# Patient Record
Sex: Female | Born: 1965
Health system: Southern US, Community
[De-identification: ages and names within clinical notes are randomized; demographics above are authoritative.]

## PROBLEM LIST (undated history)

## (undated) DIAGNOSIS — K635 Polyp of colon: Secondary | ICD-10-CM

## (undated) DIAGNOSIS — F419 Anxiety disorder, unspecified: Secondary | ICD-10-CM

## (undated) DIAGNOSIS — E119 Type 2 diabetes mellitus without complications: Secondary | ICD-10-CM

## (undated) DIAGNOSIS — Z720 Tobacco use: Secondary | ICD-10-CM

## (undated) DIAGNOSIS — E669 Obesity, unspecified: Secondary | ICD-10-CM

## (undated) DIAGNOSIS — F329 Major depressive disorder, single episode, unspecified: Secondary | ICD-10-CM

## (undated) DIAGNOSIS — K8020 Calculus of gallbladder without cholecystitis without obstruction: Secondary | ICD-10-CM

## (undated) DIAGNOSIS — I10 Essential (primary) hypertension: Secondary | ICD-10-CM

## (undated) DIAGNOSIS — F418 Other specified anxiety disorders: Secondary | ICD-10-CM

## (undated) DIAGNOSIS — F32A Depression, unspecified: Secondary | ICD-10-CM

## (undated) DIAGNOSIS — J189 Pneumonia, unspecified organism: Secondary | ICD-10-CM

## (undated) DIAGNOSIS — R011 Cardiac murmur, unspecified: Secondary | ICD-10-CM

## (undated) DIAGNOSIS — R9439 Abnormal result of other cardiovascular function study: Secondary | ICD-10-CM

## (undated) HISTORY — PX: LAPAROSCOPIC CHOLECYSTECTOMY: SUR755

## (undated) HISTORY — DX: Calculus of gallbladder without cholecystitis without obstruction: K80.20

## (undated) HISTORY — DX: Depression, unspecified: F32.A

## (undated) HISTORY — DX: Major depressive disorder, single episode, unspecified: F32.9

## (undated) HISTORY — DX: Polyp of colon: K63.5

## (undated) HISTORY — DX: Anxiety disorder, unspecified: F41.9

---

## 1998-11-28 ENCOUNTER — Emergency Department (HOSPITAL_COMMUNITY): Admission: EM | Admit: 1998-11-28 | Discharge: 1998-11-28 | Payer: Self-pay | Admitting: Emergency Medicine

## 1998-11-28 ENCOUNTER — Encounter: Payer: Self-pay | Admitting: Emergency Medicine

## 1999-05-26 ENCOUNTER — Other Ambulatory Visit: Admission: RE | Admit: 1999-05-26 | Discharge: 1999-05-26 | Payer: Self-pay | Admitting: Obstetrics and Gynecology

## 1999-11-12 ENCOUNTER — Inpatient Hospital Stay (HOSPITAL_COMMUNITY): Admission: AD | Admit: 1999-11-12 | Discharge: 1999-11-12 | Payer: Self-pay | Admitting: Obstetrics and Gynecology

## 1999-11-13 ENCOUNTER — Inpatient Hospital Stay (HOSPITAL_COMMUNITY): Admission: AD | Admit: 1999-11-13 | Discharge: 1999-11-13 | Payer: Self-pay | Admitting: *Deleted

## 1999-11-20 ENCOUNTER — Inpatient Hospital Stay (HOSPITAL_COMMUNITY): Admission: AD | Admit: 1999-11-20 | Discharge: 1999-11-20 | Payer: Self-pay | Admitting: Obstetrics and Gynecology

## 1999-11-22 ENCOUNTER — Inpatient Hospital Stay (HOSPITAL_COMMUNITY): Admission: AD | Admit: 1999-11-22 | Discharge: 1999-11-22 | Payer: Self-pay | Admitting: Obstetrics and Gynecology

## 1999-11-26 ENCOUNTER — Inpatient Hospital Stay (HOSPITAL_COMMUNITY): Admission: AD | Admit: 1999-11-26 | Discharge: 1999-11-26 | Payer: Self-pay | Admitting: Obstetrics and Gynecology

## 1999-12-07 ENCOUNTER — Inpatient Hospital Stay (HOSPITAL_COMMUNITY): Admission: AD | Admit: 1999-12-07 | Discharge: 1999-12-11 | Payer: Self-pay | Admitting: Obstetrics and Gynecology

## 2000-01-09 ENCOUNTER — Other Ambulatory Visit: Admission: RE | Admit: 2000-01-09 | Discharge: 2000-01-09 | Payer: Self-pay | Admitting: Obstetrics and Gynecology

## 2000-03-26 ENCOUNTER — Other Ambulatory Visit: Admission: RE | Admit: 2000-03-26 | Discharge: 2000-03-26 | Payer: Self-pay | Admitting: Obstetrics and Gynecology

## 2000-03-26 ENCOUNTER — Encounter (INDEPENDENT_AMBULATORY_CARE_PROVIDER_SITE_OTHER): Payer: Self-pay | Admitting: Specialist

## 2005-06-10 HISTORY — PX: CARDIAC CATHETERIZATION: SHX172

## 2005-06-12 ENCOUNTER — Ambulatory Visit: Payer: Self-pay | Admitting: Cardiology

## 2005-06-21 ENCOUNTER — Ambulatory Visit: Payer: Self-pay | Admitting: Cardiology

## 2005-06-27 ENCOUNTER — Ambulatory Visit: Payer: Self-pay | Admitting: Internal Medicine

## 2005-06-27 ENCOUNTER — Inpatient Hospital Stay (HOSPITAL_BASED_OUTPATIENT_CLINIC_OR_DEPARTMENT_OTHER): Admission: RE | Admit: 2005-06-27 | Discharge: 2005-06-27 | Payer: Self-pay | Admitting: Internal Medicine

## 2005-07-03 ENCOUNTER — Ambulatory Visit: Payer: Self-pay | Admitting: Cardiology

## 2006-08-13 ENCOUNTER — Emergency Department (HOSPITAL_COMMUNITY): Admission: EM | Admit: 2006-08-13 | Discharge: 2006-08-13 | Payer: Self-pay | Admitting: Emergency Medicine

## 2009-03-12 HISTORY — PX: SHOULDER OPEN ROTATOR CUFF REPAIR: SHX2407

## 2009-05-05 ENCOUNTER — Emergency Department (HOSPITAL_COMMUNITY): Admission: EM | Admit: 2009-05-05 | Discharge: 2009-05-05 | Payer: Self-pay | Admitting: Emergency Medicine

## 2010-07-28 NOTE — Discharge Summary (Signed)
[facility] [place]  Patient:    Victoria Lucas, Victoria Lucas                       MRN: [id number] Adm. Date:  [id number] Disc. Date: [id number] Attending:  Trevor Iha Dictator:   Danie Chandler, R.N.                           Discharge Summary  ADMITTING DIAGNOSES:          1. Intrauterine pregnancy at term.                               2. Spontaneous rupture of membranes.                               3. Onset of labor.  DISCHARGE DIAGNOSES:          1. Intrauterine pregnancy at term.                               2. Spontaneous rupture of membranes.                               3. Onset of labor.                               4. Failure to progress.  PROCEDURE:                    On December 08, 1999, primary low transverse cesarean section.  REASON FOR ADMISSION:         The patient is a 45 year old primigravida married white female who presented at term with premature rupture of membranes at 4:30 p.m. on the day of December 07, 1999.  The patient was begun on Pitocin augmentation and progressed to 4 cm dilatation.  After three to four hours she had complete lack of progression beyond this.  Adequate uterine contractions were documented by the IUPC.  She was placed on IV Cleocin for prolonged rupture of membranes.  Due to the lack of progression the patient was made ready for cesarean section.  HOSPITAL COURSE:              The patient was taken to the operating room and underwent the above-named procedure without complication.  This was productive of a viable female infant with Apgars of 8 at one minute and 9 at five minutes. Postoperatively, the patient did well.  On postoperative day #1, the patients hemoglobin was 10.3, her vital signs were stable, her white blood cell count was 18.9, and hematocrit 30.5.  On postoperative day #2, she had a good return of bowel function and was tolerating a regular diet.  She was also ambulating well without  difficulty and had good pain control.  She was discharged home on postoperative day #3.  CONDITION ON DISCHARGE:       Good.  DIET:                          Regular as tolerated.  ACTIVITY:  No heavy lifting.  No driving.  No vaginal entry.  FOLLOW-UP:                    She is to follow up in the office in three days for stable removal.  DISCHARGE INSTRUCTIONS:       She is to call for temperature greater than 100 degrees, persistent nausea, vomiting, heavy vaginal bleeding, and/or redness or drainage from the incision site.  DISCHARGE MEDICATIONS:        1. Prenatal vitamin one p.o. q.d.                               2. Tylox #30 one to two p.o. q.3-4h. p.r.n.                                  pain. DD:  12/29/99 TD:  12/30/99 Job: [id number]

## 2010-07-28 NOTE — Cardiovascular Report (Signed)
[name], [name] NO.:  [id number]   MEDICAL RECORD NO.:  [id number]          PATIENT TYPE:  OIB   LOCATION:  1962                         FACILITY:  [facility]   PHYSICIAN:  Arvilla Meres, M.D. LHCDATE OF BIRTH:  [birth year]   DATE OF PROCEDURE:  06/27/2005  DATE OF DISCHARGE:                              CARDIAC CATHETERIZATION   PRIMARY CARE PHYSICIAN:  Dr. Fara Chute in [place].   CARDIOLOGIST:  Learta Codding, M.D. [facility]   PATIENT IDENTIFICATION:  Victoria Lucas is a 45 year old woman with multiple  cardiac risk factors including obesity, hyperlipidemia, and ongoing tobacco  use who was rescinded to [facility] with chest pain.  This had both  typical and atypical features.  She ruled out for myocardial infarction with  serial cardiac enzymes.  She underwent stress echocardiogram which was  limited due to inability to reach target heart rate, however, there is no  evidence of wall motion abnormalities at submaximal levels of exercise.  Unfortunately, she has continued to have chest pain and is now referred for  a diagnostic cardiac catheterization in our outpatient laboratory.   PROCEDURES PERFORMED:  1.  Selective coronary angiography.  2.  Left heart catheterization.  3.  Left ventriculogram.   DESCRIPTION OF PROCEDURE:  The risks and benefits of the catheterization  were explained.  Consent was signed and placed on the chart.  A 4-French  arterial sheath was placed in the right femoral artery using a modified  Seldinger technique.  Standard catheters including a JL-4, 3-DRC, and angled  pigtail were used for procedure.  All catheter exchanges made over a wire.  There were no apparent complications.   1.  Central aortic pressure is 109/62 with a mean of 82.  2.  LV pressure was 105/0 with an EDP of 6.  3.  There is no aortic stenosis.   1.  Left main was short and normal.  2.  LAD was a very long vessel wrapping the apex.  There was a small first     diagonal and moderate-to-large second diagonal.  There was no      angiographic CAD.  3.  Left circumflex was a moderate-sized system.  It gave off a moderate-      sized OM-1 and a branching OM-2.  No angiographic CAD.  4.  Right coronary artery was a moderate-sized dominant vessel.  It gave off      an RV branch, a large acute marginal, a small-to-moderate PDA, and two      small PLs.  There is no angiographic CAD.   LEFT VENTRICULOGRAM:  Done the RAO position showed an EF of 55% with a  question of minimal anterior hypokinesis.  There was mild mitral valve  prolapse with no mitral regurgitation.   ASSESSMENT:  1.  Normal coronary arteries.  2.  Normal left ventricular function with a question of minimal anterior      hypokinesis of uncertain significance.  3.  Mild mitral valve prolapse without any mitral regurgitation.   PLAN/DISCUSSION:  I suspect her chest pain is noncardiac.  She will  need  aggressive risk factor modification.  I particularly stressed to her the  need for smoking cessation.  As well as control of her hyperlipidemia and  weight.      Arvilla Meres, M.D. [facility]  Electronically Signed     DB/MEDQ  D:  06/27/2005  T:  06/27/2005  Job:  [id number]   cc:   Fara Chute  Fax: [phone]   Learta Codding, M.D. [facility]  [address]  [address]  [place]  [place] [address]

## 2010-07-28 NOTE — Op Note (Signed)
[facility]  Patient:    Victoria Lucas, Victoria Lucas                       MRN: [id number] Proc. Date: 12/08/99 Adm. Date:  [id number] Attending:  Trevor Iha                           Operative Report  PREOPERATIVE DIAGNOSIS:         Intrauterine pregnancy at term with failure to progress.  POSTOPERATIVE DIAGNOSIS:        Intrauterine pregnancy at term with failure to progress.  OPERATION:                      Low transverse cesarean section.  SURGEON:                        Juluis Mire, M.D.  ANESTHESIA:                     Epidural.  ESTIMATED BLOOD LOSS:           800 cc.  PACKS AND DRAINS:               None.  INTRAOPERATIVE BLOOD PLACED:    None.  COMPLICATIONS:                  None.  INDICATIONS:                    [age]-year-old prima gravida, married white female presented at term with premature rupture of membranes at 4:30 yesterday evening.  She was begun on Pitocin augmentation and progressed to 4 cm dilatation.  After three to four hours she had complete lack of progression beyond this.  Adequate uterine contractions were documented by the intrauterine pressure catheter after given Pitocin.  She was on IV Cleocin for prolonged rupture of membranes.  Due to the lack of progression the patient was made ready for cesarean section.  The risks were discussed including the risks of infection, the risks of hemorrhage which could necessitate transfusion with the risk of AIDS or hepatitis, the risk of injury to adjacent organs including bladder, bowel or ureters that could require further exploratory surgery, risk of deep venous thrombosis and pulmonary emboli.  The patient understands indications and risks.  DESCRIPTION OF PROCEDURE:       The patient was taken to the operating room and placed in the supine position with left lateral tilt.  After a satisfactory level of epidural anesthesia was obtained, the abdomen was prepped  out with Betadine and draped in a sterile field.  A low transverse skin incision was made with a knife and carried through the subcutaneous tissue.  The anterior rectus fascia was entered sharply and incision to the fascia was extended laterally.  The fascia was taken off the of muscle superiorly and inferiorly using both blunt and sharp dissection.  The rectus muscles were separated in the midline.  The anterior peritoneum was entered sharply and incision to the peritoneum was extended both superiorly and inferiorly.  A low transverse bladder flap was developed.  A low transverse uterine incision was begun with the knife and extended laterally using manual retraction.  The infant presented in the vertex presentation and was delivered with elevation of the head and fundal pressure.  The infant was a female who  weighed 7 pounds 6 ounces.  Apgars were 8 and 9.  Umbilical cord pH is pending.  The placenta was then delivered manually.  Uterus was exteriorized for closure.  The uterus was wiped free of remaining membranes and placenta.  The uterus was closed with interlocking sutures of 0 chromic using a two layer closure technique.  We had good hemostasis.  The pelvic catheter was thoroughly irrigated.  Hemostasis was excellent.  Tubes and ovaries appeared unremarkable.  Urine output remained clear.  The uterus was then returned to the abdominal cavity.  Muscles were reapproximated with suture of 3-0 Vicryl. The fascia was closed with suture of 0 PDS.  Skin was closed with staples and Steri-Strips.  The sponge, needle and instrument counts were reported as correct by the circulating nurse x 2.  The patient tolerated the procedure well and returned to the recovery room in good condition.  Urine output remained clear at the time of closure. DD:  12/08/99 TD:  12/09/99 Job: [id number]

## 2016-06-15 ENCOUNTER — Encounter (HOSPITAL_COMMUNITY): Payer: Self-pay | Admitting: Cardiology

## 2016-06-15 ENCOUNTER — Inpatient Hospital Stay (HOSPITAL_COMMUNITY)
Admission: AD | Admit: 2016-06-15 | Discharge: 2016-06-18 | DRG: 287 | Disposition: A | Discharge status: Home / Self Care | Payer: 59 | Source: Other Acute Inpatient Hospital | Attending: Internal Medicine | Admitting: Internal Medicine

## 2016-06-15 DIAGNOSIS — E1165 Type 2 diabetes mellitus with hyperglycemia: Secondary | ICD-10-CM | POA: Diagnosis present

## 2016-06-15 DIAGNOSIS — I452 Bifascicular block: Secondary | ICD-10-CM | POA: Diagnosis not present

## 2016-06-15 DIAGNOSIS — I952 Hypotension due to drugs: Secondary | ICD-10-CM | POA: Diagnosis not present

## 2016-06-15 DIAGNOSIS — F17200 Nicotine dependence, unspecified, uncomplicated: Secondary | ICD-10-CM | POA: Diagnosis not present

## 2016-06-15 DIAGNOSIS — Z7984 Long term (current) use of oral hypoglycemic drugs: Secondary | ICD-10-CM | POA: Diagnosis not present

## 2016-06-15 DIAGNOSIS — R9430 Abnormal result of cardiovascular function study, unspecified: Secondary | ICD-10-CM | POA: Diagnosis present

## 2016-06-15 DIAGNOSIS — E785 Hyperlipidemia, unspecified: Secondary | ICD-10-CM | POA: Diagnosis not present

## 2016-06-15 DIAGNOSIS — R079 Chest pain, unspecified: Secondary | ICD-10-CM | POA: Diagnosis present

## 2016-06-15 DIAGNOSIS — I25110 Atherosclerotic heart disease of native coronary artery with unstable angina pectoris: Principal | ICD-10-CM | POA: Diagnosis present

## 2016-06-15 DIAGNOSIS — I10 Essential (primary) hypertension: Secondary | ICD-10-CM | POA: Diagnosis not present

## 2016-06-15 DIAGNOSIS — Z720 Tobacco use: Secondary | ICD-10-CM

## 2016-06-15 DIAGNOSIS — T463X5A Adverse effect of coronary vasodilators, initial encounter: Secondary | ICD-10-CM | POA: Diagnosis not present

## 2016-06-15 DIAGNOSIS — R072 Precordial pain: Secondary | ICD-10-CM | POA: Diagnosis not present

## 2016-06-15 DIAGNOSIS — E669 Obesity, unspecified: Secondary | ICD-10-CM | POA: Diagnosis present

## 2016-06-15 DIAGNOSIS — Z6832 Body mass index (BMI) 32.0-32.9, adult: Secondary | ICD-10-CM

## 2016-06-15 DIAGNOSIS — E119 Type 2 diabetes mellitus without complications: Secondary | ICD-10-CM

## 2016-06-15 DIAGNOSIS — I200 Unstable angina: Secondary | ICD-10-CM | POA: Diagnosis present

## 2016-06-15 HISTORY — DX: Cardiac murmur, unspecified: R01.1

## 2016-06-15 HISTORY — DX: Tobacco use: Z72.0

## 2016-06-15 HISTORY — DX: Essential (primary) hypertension: I10

## 2016-06-15 HISTORY — DX: Abnormal result of other cardiovascular function study: R94.39

## 2016-06-15 HISTORY — DX: Pneumonia, unspecified organism: J18.9

## 2016-06-15 HISTORY — DX: Other specified anxiety disorders: F41.8

## 2016-06-15 HISTORY — DX: Type 2 diabetes mellitus without complications: E11.9

## 2016-06-15 HISTORY — DX: Obesity, unspecified: E66.9

## 2016-06-15 LAB — GLUCOSE, CAPILLARY: GLUCOSE-CAPILLARY: 207 mg/dL — AB (ref 65–99)

## 2016-06-15 MED ORDER — HEPARIN (PORCINE) IN NACL 100-0.45 UNIT/ML-% IJ SOLN
1750.0000 [IU]/h | INTRAMUSCULAR | Status: DC
  Administered 2016-06-16 – 2016-06-17 (×3): 1600 [IU]/h via INTRAVENOUS
  Filled 2016-06-15 (×3): qty 250

## 2016-06-15 MED ORDER — ASPIRIN EC 81 MG PO TBEC
81.0000 mg | DELAYED_RELEASE_TABLET | Freq: Every day | ORAL | Status: DC
  Administered 2016-06-16 – 2016-06-18 (×3): 81 mg via ORAL
  Filled 2016-06-15 (×3): qty 1

## 2016-06-15 MED ORDER — INSULIN ASPART 100 UNIT/ML ~~LOC~~ SOLN
0.0000 [IU] | Freq: Three times a day (TID) | SUBCUTANEOUS | Status: DC
  Administered 2016-06-16: 3 [IU] via SUBCUTANEOUS
  Administered 2016-06-16 (×2): 5 [IU] via SUBCUTANEOUS
  Administered 2016-06-17: 2 [IU] via SUBCUTANEOUS
  Administered 2016-06-17 (×2): 5 [IU] via SUBCUTANEOUS
  Administered 2016-06-18: 3 [IU] via SUBCUTANEOUS

## 2016-06-15 MED ORDER — METOPROLOL TARTRATE 12.5 MG HALF TABLET
12.5000 mg | ORAL_TABLET | Freq: Two times a day (BID) | ORAL | Status: DC
  Administered 2016-06-15 – 2016-06-16 (×2): 12.5 mg via ORAL
  Filled 2016-06-15 (×2): qty 1

## 2016-06-15 MED ORDER — ATORVASTATIN CALCIUM 40 MG PO TABS
40.0000 mg | ORAL_TABLET | Freq: Every day | ORAL | Status: DC
  Administered 2016-06-16 – 2016-06-17 (×2): 40 mg via ORAL
  Filled 2016-06-15 (×2): qty 1

## 2016-06-15 MED ORDER — ACETAMINOPHEN 325 MG PO TABS
325.0000 mg | ORAL_TABLET | Freq: Four times a day (QID) | ORAL | Status: DC | PRN
  Administered 2016-06-16 – 2016-06-17 (×3): 325 mg via ORAL
  Filled 2016-06-15 (×3): qty 1

## 2016-06-15 MED ORDER — NITROGLYCERIN 0.4 MG SL SUBL
0.4000 mg | SUBLINGUAL_TABLET | SUBLINGUAL | Status: DC | PRN
  Administered 2016-06-16 – 2016-06-17 (×4): 0.4 mg via SUBLINGUAL
  Filled 2016-06-15 (×3): qty 1

## 2016-06-15 NOTE — Progress Notes (Signed)
ANTICOAGULATION CONSULT NOTE  Pharmacy Consult for heparin Indication: chest pain/ACS  Heparin Dosing Weight: 76.6 kg   Assessment: 50 yof initially presented to [facility] for CP, UA. She ruled out for MI with negative enzymes. Pt had a positive Myoview stress test and was transferred to [facility] for definitive LHC - planned for Monday. Pharmacy consulted to dose heparin - started at [facility] and verified currently running at 1360 units/h. Not on anticoagulation PTA. CBC pending.  Goal of Therapy:  Heparin level 0.3-0.7 units/ml Monitor platelets by anticoagulation protocol: Yes   Plan:  Heparin at 1360 units/h (from transfer) 6h heparin level Daily heparin level/CBC Monitor for s/sx bleeding Cath planned for Monday   Victoria Lucas, PharmD, BCPS Clinical Pharmacist 06/15/2016 7:39 PM

## 2016-06-15 NOTE — H&P (Signed)
Patient ID: Victoria Lucas MRN: [id number], DOB/AGE: Aug 14, 1965   Admit date: 06/15/2016   Primary Physician:  Dr. Neita Carp  Primary Cardiologist: New   Pt. Profile:  51 y/o female with h/o normal cath in 2007, HTN, T2DM and tobacco use, who initially presented to [facility] for evaluation for chest pain c/w unstable angina. She ruled out for MI with negative enzymes. Pt had a positive Myoview stress test and was transferred to [facility] for definitive LHC.    Problem List  Past Medical History:  Diagnosis Date  . Abnormal nuclear stress test   . DM (diabetes mellitus), type 2 (HCC)   . HTN (hypertension)   . Obesity (BMI 30-39.9)   . Tobacco abuse     Past Surgical History:  Procedure Laterality Date  . CESAREAN SECTION    . CHOLECYSTECTOMY    . ROTATOR CUFF REPAIR      Allergies  Allergies  Allergen Reactions  . Codeine Rash  . Penicillins Rash    Has patient had a PCN reaction causing immediate rash, facial/tongue/throat swelling, SOB or lightheadedness with hypotension: Yes Has patient had a PCN reaction causing severe rash involving mucus membranes or skin necrosis: No Has patient had a PCN reaction that required hospitalization No Has patient had a PCN reaction occurring within the last 10 years: Yes If all of the above answers are "NO", then may proceed with Cephalosporin use.   Marland Kitchen Propoxyphene   . Tamoxifen    HPI  51 y/o female with h/o normal cath in 2007, HTN, T2DM and tobacco use, who initially presented to [facility] for evaluation for chest pain c/w unstable angina. She ruled out for MI with negative enzymes. Pt had a positive Myoview stress test and was transferred to [facility] for definitive LHC.    Stress test demonstrated moderate reversibility involving the anterior apical myocardium with mild left ventricular dilatation with mild anterior anteroseptal segment hypokinesis noted.   Pt states her CP started 4 days ago. Intermittent SSC pressure  radiating to her back with mild dyspnea. The pain typically occurred at rest and would last from 4-5 minutes with occasional radiation to her left arm.  Not associated with sweating but mild shortness of breath with it.  She has arrived at [facility]. She is roomed and stable. She is CP free and on IV heparin. She states she gave up smoking 3 days ago after CP started. The only prescription medication she is on is Metformin. States her blood sugars usually run high.   Home Medications  Metformin   Family History  Family History  Problem Relation Age of Onset  . Valvular heart disease Paternal Grandfather     Social History  Social History   Social History  . Marital status: Married    Spouse name: N/A  . Number of children: N/A  . Years of education: N/A   Occupational History  . Not on file.   Social History Main Topics  . Smoking status: Current Every Day Smoker  . Smokeless tobacco: Current User  . Alcohol use Not on file  . Drug use: Unknown  . Sexual activity: Not on file   Other Topics Concern  . Not on file   Social History Narrative  . No narrative on file     Review of Systems She has lost about 60 pounds in the past year or 2.  She has been able to cut back her diabetic medicines.  She doesn't really have  any significant arthritis. All other systems reviewed and are otherwise negative except as noted above.  Physical Exam  Blood pressure 117/62, pulse 77, temperature 98.1 F (36.7 C), temperature source Oral, resp. rate 18, height  (1.651 m), weight 89.2 kg (196 lb 11.2 oz), SpO2 99 %.  General: Pleasant, NAD, moderately obese  Psych: Normal affect. Neuro: Alert and oriented X 3. Moves all extremities spontaneously. HEENT: Normal  Neck: Supple without bruits or JVD. Lungs:  Resp regular and unlabored, CTA. Heart: RRR no s3, s4, or murmurs. Abdomen: Soft, non-tender, non-distended, BS + x 4.  Extremities: No clubbing, cyanosis or edema. DP/PT/Radials  2+ and equal bilaterally. Pulses: Her radials and femorals are 2+  Labs   ECG  12 lead pending --   ASSESSMENT AND PLAN  Active Problems:   Chest pain   Abnormal result of cardiovascular function study   Diabetes mellitus type 2, noninsulin dependent (HCC)   Obesity (BMI 30-39.9)   Hypertension, essential   Tobacco abuse   Unstable angina (HCC)   1. Unstable Angina w/ Abnormal NST: stress test at outside hospital showed moderate reversibility involving the anterior apical myocardium with mild left ventricular dilatation with mild anterior anteroseptal segment hypokinesis noted.  She is currently CP free. Continue IV heparin. Plan for [facility] on Monday. Start ASA and statin. Monitor on telemetry. Hold Metformin.    Signed, Boyce Medici, PA-C, MHS 06/15/2016,7:25 PM [facility] Pager: [phone]  Patient seen and examined, additional information updated into chart above.  Side records reviewed.  She had a normal catheterization 10 years ago and has risk factors of family history and her grandparents as well as smoking and diabetes.  EKG is pending at the present time  She is on heparin and catheterization is planned for Monday. Cardiac catheterization was discussed with the patient fully including risks of myocardial infarction, death, stroke, bleeding, arrhythmia, dye allergy, renal insufficiency or bleeding.  The patient understands and is willing to proceed.  Possibility of intervention at the same time also discussed with patient and husband and they understand and are agreeable to proceed.  Darden Palmer. MD [facility] 06/15/2016 8:08 PM

## 2016-06-15 NOTE — Progress Notes (Addendum)
Patient arrived the unit from [facility], assessment completed, see flowsheet, placed on tele ccmd notified,  Attending MD notified, bed in lowest position, call bell within reach will continue to monitor.  Heparin infusion continue at 13.6 started at [facility].

## 2016-06-16 LAB — LIPID PANEL
CHOL/HDL RATIO: 3.5 ratio
CHOLESTEROL: 166 mg/dL (ref 0–200)
HDL: 47 mg/dL (ref >40)
LDL Cholesterol: 69 mg/dL (ref 0–99)
Triglycerides: 248 mg/dL — ABNORMAL HIGH (ref <150)
VLDL: 50 mg/dL — ABNORMAL HIGH (ref 0–40)

## 2016-06-16 LAB — BASIC METABOLIC PANEL
Anion gap: 12 (ref 5–15)
BUN: 10 mg/dL (ref 6–20)
CO2: 20 mmol/L — ABNORMAL LOW (ref 22–32)
Calcium: 8.9 mg/dL (ref 8.9–10.3)
Chloride: 103 mmol/L (ref 101–111)
Creatinine, Ser: 0.74 mg/dL (ref 0.44–1.00)
GFR calc Af Amer: >60 mL/min (ref >60)
GLUCOSE: 287 mg/dL — AB (ref 65–99)
Potassium: 3.7 mmol/L (ref 3.5–5.1)
Sodium: 135 mmol/L (ref 135–145)

## 2016-06-16 LAB — GLUCOSE, CAPILLARY
GLUCOSE-CAPILLARY: 277 mg/dL — AB (ref 65–99)
GLUCOSE-CAPILLARY: 278 mg/dL — AB (ref 65–99)
Glucose-Capillary: 234 mg/dL — ABNORMAL HIGH (ref 65–99)
Glucose-Capillary: 219 mg/dL — ABNORMAL HIGH (ref 65–99)

## 2016-06-16 LAB — CBC
HCT: 43.4 % (ref 36.0–46.0)
Hemoglobin: 14.6 g/dL (ref 12.0–15.0)
MCH: 29.7 pg (ref 26.0–34.0)
MCHC: 33.6 g/dL (ref 30.0–36.0)
MCV: 88.4 fL (ref 78.0–100.0)
PLATELETS: 239 10*3/uL (ref 150–400)
RBC: 4.91 MIL/uL (ref 3.87–5.11)
RDW: 14.1 % (ref 11.5–15.5)
WBC: 8.3 10*3/uL (ref 4.0–10.5)

## 2016-06-16 LAB — HEPARIN LEVEL (UNFRACTIONATED)
HEPARIN UNFRACTIONATED: 0.54 [IU]/mL (ref 0.30–0.70)
Heparin Unfractionated: 0.16 IU/mL — ABNORMAL LOW (ref 0.30–0.70)

## 2016-06-16 MED ORDER — METOPROLOL TARTRATE 25 MG PO TABS
25.0000 mg | ORAL_TABLET | Freq: Two times a day (BID) | ORAL | Status: DC
  Administered 2016-06-16 – 2016-06-18 (×4): 25 mg via ORAL
  Filled 2016-06-16 (×4): qty 1

## 2016-06-16 MED ORDER — SENNOSIDES-DOCUSATE SODIUM 8.6-50 MG PO TABS
1.0000 | ORAL_TABLET | Freq: Once | ORAL | Status: AC
  Administered 2016-06-16: 1 via ORAL
  Filled 2016-06-16: qty 1

## 2016-06-16 MED ORDER — HEPARIN BOLUS VIA INFUSION
2000.0000 [IU] | Freq: Once | INTRAVENOUS | Status: AC
  Administered 2016-06-16: 2000 [IU] via INTRAVENOUS
  Filled 2016-06-16: qty 2000

## 2016-06-16 MED ORDER — NITROGLYCERIN 2 % TD OINT
0.5000 [in_us] | TOPICAL_OINTMENT | Freq: Once | TRANSDERMAL | Status: AC
  Administered 2016-06-16: 0.5 [in_us] via TOPICAL
  Filled 2016-06-16: qty 30

## 2016-06-16 NOTE — Progress Notes (Signed)
ANTICOAGULATION CONSULT NOTE - Follow Up Consult  Pharmacy Consult for Heparin Indication: chest pain/ACS  Allergies  Allergen Reactions  . Codeine Rash  . Penicillins Rash    Has patient had a PCN reaction causing immediate rash, facial/tongue/throat swelling, SOB or lightheadedness with hypotension: Yes Has patient had a PCN reaction causing severe rash involving mucus membranes or skin necrosis: No Has patient had a PCN reaction that required hospitalization No Has patient had a PCN reaction occurring within the last 10 years: Yes If all of the above answers are "NO", then may proceed with Cephalosporin use.   Marland Kitchen Propoxyphene   . Tamoxifen     Patient Measurements: Height:  (165.1 cm) Weight: 196 lb 11.2 oz (89.2 kg) IBW/kg (Calculated) : 57 Heparin Dosing Weight: 77 kg  Vital Signs: Temp: 97.6 F (36.4 C) (04/06 2132) Temp Source: Oral (04/06 2132) BP: 104/50 (04/06 2132) Pulse Rate: 68 (04/06 2200)  Labs:  Recent Labs  06/16/16 0054  HGB 14.6  HCT 43.4  PLT 239  HEPARINUNFRC 0.16*  CREATININE 0.74    Estimated Creatinine Clearance: 91.8 mL/min (by C-G formula based on SCr of 0.74 mg/dL).   Assessment: 51 y.o. F on heparin for r/o ACS. Plan for cath on Monday. Heparin level subtherapeutic on 1350 units/hr. CBC stable. No issues with line or bleeding reported per RN.  Goal of Therapy:  Heparin level 0.3-0.7 units/ml Monitor platelets by anticoagulation protocol: Yes   Plan:  Rebolus heparin 2000 units Increase gtt to 1600 units/hr F/u 6hr heparin level  Christoper Fabian, PharmD, BCPS Clinical pharmacist, pager [phone] 06/16/2016,2:59 AM

## 2016-06-16 NOTE — Progress Notes (Signed)
Subjective:  She had some intermittent vague chest discomfort relieved with nitroglycerin.  Currently pain-free.  Objective:  Vital Signs in the last 24 hours: BP 115/69 (BP Location: Left Arm)   Pulse 69   Temp 97.9 F (36.6 C) (Oral)   Resp 18   Ht  (1.651 m)   Wt 89.2 kg (196 lb 11.2 oz)   SpO2 98%   BMI 32.73 kg/m   Physical Exam: Obese friendly female in no acute distress Lungs:  Clear Cardiac:  Regular rhythm, normal S1 and S2, no S3 Extremities:  No edema present  Intake/Output from previous day: 04/06 0701 - 04/07 0700 In: 392.6 [P.O.:240; I.V.:152.6] Out: -   Weight Filed Weights   06/15/16 1836  Weight: 89.2 kg (196 lb 11.2 oz)    Lab Results: Basic Metabolic Panel:  Recent Labs  62/13/08 0054  NA 135  K 3.7  CL 103  CO2 20*  GLUCOSE 287*  BUN 10  CREATININE 0.74   CBC:  Recent Labs  06/16/16 0054  WBC 8.3  HGB 14.6  HCT 43.4  MCV 88.4  PLT 239   Telemetry: Sinus rhythm  Assessment/Plan:  1.  Unstable angina pectoris 2.  Obesity 3.  Diabetes mellitus  Recommendations:  Has had some chest discomfort and will increase beta blocker.  Check EKG today.  Catheterization is planned for Monday.     Darden Palmer  MD [facility]  06/16/2016, 12:17 PM

## 2016-06-16 NOTE — Progress Notes (Signed)
ANTICOAGULATION CONSULT NOTE - Follow Up Consult  Pharmacy Consult for Heparin Indication: chest pain/ACS  Allergies  Allergen Reactions  . Codeine Rash  . Penicillins Rash    Has patient had a PCN reaction causing immediate rash, facial/tongue/throat swelling, SOB or lightheadedness with hypotension: Yes Has patient had a PCN reaction causing severe rash involving mucus membranes or skin necrosis: No Has patient had a PCN reaction that required hospitalization No Has patient had a PCN reaction occurring within the last 10 years: Yes If all of the above answers are "NO", then may proceed with Cephalosporin use.   Marland Kitchen Propoxyphene   . Tamoxifen     Patient Measurements: Height:  (165.1 cm) Weight: 196 lb 11.2 oz (89.2 kg) IBW/kg (Calculated) : 57 Heparin Dosing Weight:  76.6 kg  Vital Signs: Temp: 97.9 F (36.6 C) (04/07 0454) Temp Source: Oral (04/07 0454) BP: 115/69 (04/07 0454) Pulse Rate: 69 (04/07 0454)  Labs:  Recent Labs  06/16/16 0054 06/16/16 0838  HGB 14.6  --   HCT 43.4  --   PLT 239  --   HEPARINUNFRC 0.16* 0.54  CREATININE 0.74  --     Estimated Creatinine Clearance: 91.8 mL/min (by C-G formula based on SCr of 0.74 mg/dL).  Assessment: 50 yof initially presented to [facility] for CP, UA. She ruled out for MI with negative enzymes. Pt had a positive Myoview stress test and was transferred to [facility] for definitive LHC - planned for Monday. Pharmacy consulted to dose heparin - started at [facility] and verified currently running at 1360 units/h. Not on anticoagulation PTA. CBC pending.  Anticoag: CP. Needs cath. HL 0.16>0.54 now in goal. CBC WNL  Goal of Therapy:  Heparin level 0.3-0.7 units/ml Monitor platelets by anticoagulation protocol: Yes   Plan:  Cath Monday Heparin 1600 units/hr Daily HL and CBC  Bret Vanessen S. Merilynn Finland, PharmD, BCPS Clinical Staff Pharmacist Pager [phone]  Misty Stanley Stillinger 06/16/2016,10:20 AM

## 2016-06-16 NOTE — Progress Notes (Signed)
Pt complained of CP. Asymptomatic. EKG and Vitals stable. MD notified and intervention given. Pt is resting comfortably in bed with no complaints at this will. Will continue to monitor closely.

## 2016-06-17 DIAGNOSIS — I452 Bifascicular block: Secondary | ICD-10-CM | POA: Diagnosis present

## 2016-06-17 LAB — GLUCOSE, CAPILLARY
GLUCOSE-CAPILLARY: 260 mg/dL — AB (ref 65–99)
GLUCOSE-CAPILLARY: 240 mg/dL — AB (ref 65–99)
Glucose-Capillary: 286 mg/dL — ABNORMAL HIGH (ref 65–99)
Glucose-Capillary: 166 mg/dL — ABNORMAL HIGH (ref 65–99)

## 2016-06-17 LAB — TROPONIN I
Troponin I: <0.03 ng/mL (ref <0.03)
Troponin I: <0.03 ng/mL (ref <0.03)

## 2016-06-17 LAB — CBC
HCT: 40.9 % (ref 36.0–46.0)
Hemoglobin: 13.8 g/dL (ref 12.0–15.0)
MCH: 29 pg (ref 26.0–34.0)
MCHC: 33.7 g/dL (ref 30.0–36.0)
MCV: 85.9 fL (ref 78.0–100.0)
Platelets: 268 10*3/uL (ref 150–400)
RBC: 4.76 MIL/uL (ref 3.87–5.11)
RDW: 14 % (ref 11.5–15.5)
WBC: 8 10*3/uL (ref 4.0–10.5)

## 2016-06-17 LAB — BASIC METABOLIC PANEL
Anion gap: 5 (ref 5–15)
BUN: 10 mg/dL (ref 6–20)
CO2: 24 mmol/L (ref 22–32)
CREATININE: 0.64 mg/dL (ref 0.44–1.00)
Calcium: 8.7 mg/dL — ABNORMAL LOW (ref 8.9–10.3)
Chloride: 107 mmol/L (ref 101–111)
Glucose, Bld: 231 mg/dL — ABNORMAL HIGH (ref 65–99)
Potassium: 3.7 mmol/L (ref 3.5–5.1)
SODIUM: 136 mmol/L (ref 135–145)

## 2016-06-17 LAB — HEMOGLOBIN A1C
Hgb A1c MFr Bld: 10.5 % — ABNORMAL HIGH (ref 4.8–5.6)
MEAN PLASMA GLUCOSE: 255 mg/dL

## 2016-06-17 LAB — HEPARIN LEVEL (UNFRACTIONATED): HEPARIN UNFRACTIONATED: 0.39 [IU]/mL (ref 0.30–0.70)

## 2016-06-17 MED ORDER — SODIUM CHLORIDE 0.9 % WEIGHT BASED INFUSION
1.0000 mL/kg/h | INTRAVENOUS | Status: DC
  Administered 2016-06-18: 1 mL/kg/h via INTRAVENOUS

## 2016-06-17 MED ORDER — ASPIRIN 81 MG PO CHEW
81.0000 mg | CHEWABLE_TABLET | ORAL | Status: AC
  Administered 2016-06-18: 81 mg via ORAL
  Filled 2016-06-17: qty 1

## 2016-06-17 MED ORDER — NITROGLYCERIN IN D5W 200-5 MCG/ML-% IV SOLN
0.0000 ug/min | INTRAVENOUS | Status: DC
  Administered 2016-06-17: 5 ug/min via INTRAVENOUS
  Filled 2016-06-17: qty 250

## 2016-06-17 MED ORDER — ALPRAZOLAM 0.5 MG PO TABS
0.5000 mg | ORAL_TABLET | Freq: Once | ORAL | Status: AC
  Administered 2016-06-17: 0.5 mg via ORAL
  Filled 2016-06-17: qty 1

## 2016-06-17 MED ORDER — NITROGLYCERIN 2 % TD OINT
0.5000 [in_us] | TOPICAL_OINTMENT | Freq: Four times a day (QID) | TRANSDERMAL | Status: DC
  Filled 2016-06-17: qty 30

## 2016-06-17 MED ORDER — SODIUM CHLORIDE 0.9% FLUSH
3.0000 mL | Freq: Two times a day (BID) | INTRAVENOUS | Status: DC
  Administered 2016-06-18: 3 mL via INTRAVENOUS

## 2016-06-17 MED ORDER — SODIUM CHLORIDE 0.9 % IV SOLN: 250.0000 mL | INTRAVENOUS | Status: DC | PRN

## 2016-06-17 MED ORDER — SODIUM CHLORIDE 0.9% FLUSH: 3.0000 mL | INTRAVENOUS | Status: DC | PRN

## 2016-06-17 MED ORDER — SODIUM CHLORIDE 0.9 % WEIGHT BASED INFUSION
3.0000 mL/kg/h | INTRAVENOUS | Status: DC
  Administered 2016-06-18: 3 mL/kg/h via INTRAVENOUS

## 2016-06-17 NOTE — Progress Notes (Signed)
Nitro drip not given now because of low blood pressure BP 90/47 HR 64, patient said she is not having chest pain at this time, will continue to monitor

## 2016-06-17 NOTE — Progress Notes (Signed)
Patient c/o of chest pain 8/10 nitro 2 SL given, patient now resting quietly in bed, will continue to monitor.

## 2016-06-17 NOTE — Progress Notes (Signed)
  Called by nurse.  Patient complaining of recurrent chest pain. She had relief last night with 1 time dose of NTP. Reviewed ECG done at 10:30.  No acute changes.    PLAN: 1. NTP 1 inch Q 6 hours 2. Cycle Troponin Q 6 hours x 3 3. RN to call if chest pain does not resolve with NTP.  Tereso Newcomer, PA-C    06/17/2016 10:44 AM

## 2016-06-17 NOTE — Progress Notes (Signed)
ANTICOAGULATION CONSULT NOTE - Follow Up Consult  Pharmacy Consult for Heparin Indication: chest pain/ACS  Allergies  Allergen Reactions  . Codeine Rash  . Penicillins Rash    Has patient had a PCN reaction causing immediate rash, facial/tongue/throat swelling, SOB or lightheadedness with hypotension: Yes Has patient had a PCN reaction causing severe rash involving mucus membranes or skin necrosis: No Has patient had a PCN reaction that required hospitalization No Has patient had a PCN reaction occurring within the last 10 years: Yes If all of the above answers are "NO", then may proceed with Cephalosporin use.   Marland Kitchen Propoxyphene   . Tamoxifen     Patient Measurements: Height:  (165.1 cm) Weight: 196 lb 11.2 oz (89.2 kg) IBW/kg (Calculated) : 57 Heparin Dosing Weight:  76.6 kg  Vital Signs: Temp: 97.9 F (36.6 C) (04/08 0914) Temp Source: Oral (04/08 0914) BP: 115/60 (04/08 1035) Pulse Rate: 59 (04/08 1035)  Labs:  Recent Labs  06/16/16 0054 06/16/16 0838 06/17/16 0241  HGB 14.6  --  13.8  HCT 43.4  --  40.9  PLT 239  --  268  HEPARINUNFRC 0.16* 0.54 0.39  CREATININE 0.74  --  0.64    Estimated Creatinine Clearance: 91.8 mL/min (by C-G formula based on SCr of 0.64 mg/dL).  Assessment: 50 yof initially presented to [facility] for CP, UA. She ruled out for MI with negative enzymes. Pt had a positive Myoview stress test and was transferred to [facility] for definitive LHC - planned for Monday. Pharmacy consulted to dose heparin - started at [facility] and verified currently running at 1360 units/h. Not on anticoagulation PTA. CBC pending.  Anticoag: CP. Needs cath. HL 0.16>0.54>90.39. CBC WNL but Hgb 13.6>13.8. Plts stable.  Goal of Therapy:  Heparin level 0.3-0.7 units/ml Monitor platelets by anticoagulation protocol: Yes   Plan:  Cath Monday Heparin 1600 units/hr Daily HL and CBC  Jesenia Spera S. Merilynn Finland, PharmD, BCPS Clinical Staff Pharmacist Pager  [phone]  Misty Stanley Stillinger 06/17/2016,11:40 AM

## 2016-06-17 NOTE — Progress Notes (Signed)
Subjective:  Has had recurrent pain and nitroglycerin paste was applied.  She has had recurrent pain today but does not appear to be any acute distress.  EKG done earlier shows bifascicular block but no changes.    Objective:  Vital Signs in the last 24 hours: BP 115/60 (BP Location: Right Arm)   Pulse (!) 59   Temp 97.9 F (36.6 C) (Oral)   Resp 18   Ht  (1.651 m)   Wt 89.2 kg (196 lb 11.2 oz)   SpO2 97%   BMI 32.73 kg/m   Physical Exam: Obese friendly female in no acute distress Lungs:  Clear Cardiac:  Regular rhythm, normal S1 and S2, no S3 Extremities:  No edema present  Intake/Output from previous day: 04/07 0701 - 04/08 0700 In: 162.1 [I.V.:162.1] Out: -   Weight Filed Weights   06/15/16 1836  Weight: 89.2 kg (196 lb 11.2 oz)    Lab Results: Basic Metabolic Panel:  Recent Labs  40/98/11 0054 06/17/16 0241  NA 135 136  K 3.7 3.7  CL 103 107  CO2 20* 24  GLUCOSE 287* 231*  BUN 10 10  CREATININE 0.74 0.64   CBC:  Recent Labs  06/16/16 0054 06/17/16 0241  WBC 8.3 8.0  HGB 14.6 13.8  HCT 43.4 40.9  MCV 88.4 85.9  PLT 239 268   Telemetry: Sinus rhythm  Assessment/Plan:  1.  Unstable angina pectoris 2.  Obesity 3.  Diabetes mellitus  Recommendations:  Has had some chest discomfort. We'll give sublingual nitroglycerin and begin on intravenous nitroglycerin.  Catheterization is planned for Monday.  Cardiac catheterization was discussed with the patient fully including risks of myocardial infarction, death, stroke, bleeding, arrhythmia, dye allergy, renal insufficiency or bleeding.  The patient understands and is willing to proceed.  Possibility of intervention at the same time also discussed with patient and they understand and are agreeable to proceed     W. Ashley Royalty  MD [facility] Cardiology  06/17/2016, 11:34 AM

## 2016-06-18 ENCOUNTER — Encounter (HOSPITAL_COMMUNITY): Payer: Self-pay | Admitting: Cardiovascular Disease

## 2016-06-18 ENCOUNTER — Encounter (HOSPITAL_COMMUNITY)
Admission: AD | Disposition: A | Discharge status: Home / Self Care | Payer: Self-pay | Source: Other Acute Inpatient Hospital | Attending: Internal Medicine

## 2016-06-18 DIAGNOSIS — I10 Essential (primary) hypertension: Secondary | ICD-10-CM

## 2016-06-18 DIAGNOSIS — I200 Unstable angina: Secondary | ICD-10-CM

## 2016-06-18 DIAGNOSIS — R9430 Abnormal result of cardiovascular function study, unspecified: Secondary | ICD-10-CM

## 2016-06-18 DIAGNOSIS — R072 Precordial pain: Secondary | ICD-10-CM

## 2016-06-18 HISTORY — PX: LEFT HEART CATH AND CORONARY ANGIOGRAPHY: CATH118249

## 2016-06-18 LAB — PROTIME-INR
INR: 0.9
Prothrombin Time: 12.1 seconds (ref 11.4–15.2)

## 2016-06-18 LAB — BASIC METABOLIC PANEL
ANION GAP: 8 (ref 5–15)
BUN: 12 mg/dL (ref 6–20)
CALCIUM: 8.7 mg/dL — AB (ref 8.9–10.3)
CHLORIDE: 104 mmol/L (ref 101–111)
CO2: 22 mmol/L (ref 22–32)
Creatinine, Ser: 0.62 mg/dL (ref 0.44–1.00)
GFR calc Af Amer: >60 mL/min (ref >60)
GFR calc non Af Amer: >60 mL/min (ref >60)
Glucose, Bld: 279 mg/dL — ABNORMAL HIGH (ref 65–99)
Potassium: 3.7 mmol/L (ref 3.5–5.1)
Sodium: 134 mmol/L — ABNORMAL LOW (ref 135–145)

## 2016-06-18 LAB — CBC
HEMATOCRIT: 41.3 % (ref 36.0–46.0)
Hemoglobin: 13.9 g/dL (ref 12.0–15.0)
MCH: 29.2 pg (ref 26.0–34.0)
MCHC: 33.7 g/dL (ref 30.0–36.0)
MCV: 86.8 fL (ref 78.0–100.0)
PLATELETS: 268 10*3/uL (ref 150–400)
RBC: 4.76 MIL/uL (ref 3.87–5.11)
RDW: 14.4 % (ref 11.5–15.5)
WBC: 9 10*3/uL (ref 4.0–10.5)

## 2016-06-18 LAB — HEPARIN LEVEL (UNFRACTIONATED): Heparin Unfractionated: 0.23 IU/mL — ABNORMAL LOW (ref 0.30–0.70)

## 2016-06-18 LAB — GLUCOSE, CAPILLARY
GLUCOSE-CAPILLARY: 165 mg/dL — AB (ref 65–99)
Glucose-Capillary: 225 mg/dL — ABNORMAL HIGH (ref 65–99)

## 2016-06-18 SURGERY — LEFT HEART CATH AND CORONARY ANGIOGRAPHY
Anesthesia: LOCAL

## 2016-06-18 MED ORDER — IOPAMIDOL (ISOVUE-370) INJECTION 76%
INTRAVENOUS | Status: DC | PRN
  Administered 2016-06-18: 70 mL via INTRA_ARTERIAL

## 2016-06-18 MED ORDER — SODIUM CHLORIDE 0.9 % IV SOLN: 250.0000 mL | INTRAVENOUS | Status: DC | PRN

## 2016-06-18 MED ORDER — HEPARIN SODIUM (PORCINE) 1000 UNIT/ML IJ SOLN
INTRAMUSCULAR | Status: DC | PRN
  Administered 2016-06-18: 4500 [IU] via INTRAVENOUS

## 2016-06-18 MED ORDER — FENTANYL CITRATE (PF) 100 MCG/2ML IJ SOLN
INTRAMUSCULAR | Status: DC | PRN
  Administered 2016-06-18: 50 ug via INTRAVENOUS

## 2016-06-18 MED ORDER — HEPARIN SODIUM (PORCINE) 1000 UNIT/ML IJ SOLN
INTRAMUSCULAR | Status: AC
  Filled 2016-06-18: qty 1

## 2016-06-18 MED ORDER — HEPARIN (PORCINE) IN NACL 2-0.9 UNIT/ML-% IJ SOLN
INTRAMUSCULAR | Status: AC
  Filled 2016-06-18: qty 1000

## 2016-06-18 MED ORDER — ASPIRIN 81 MG PO TBEC: 81.0000 mg | DELAYED_RELEASE_TABLET | Freq: Every day | ORAL | Status: AC

## 2016-06-18 MED ORDER — MIDAZOLAM HCL 2 MG/2ML IJ SOLN
INTRAMUSCULAR | Status: AC
  Filled 2016-06-18: qty 2

## 2016-06-18 MED ORDER — LIDOCAINE HCL (PF) 1 % IJ SOLN
INTRAMUSCULAR | Status: DC | PRN
  Administered 2016-06-18: 2 mL via INTRADERMAL

## 2016-06-18 MED ORDER — VERAPAMIL HCL 2.5 MG/ML IV SOLN
INTRAVENOUS | Status: AC
  Filled 2016-06-18: qty 2

## 2016-06-18 MED ORDER — VERAPAMIL HCL 2.5 MG/ML IV SOLN
INTRAVENOUS | Status: DC | PRN
  Administered 2016-06-18: 10 mL via INTRA_ARTERIAL

## 2016-06-18 MED ORDER — SODIUM CHLORIDE 0.9 % IV SOLN
INTRAVENOUS | Status: DC
  Administered 2016-06-18: 13:00:00 via INTRAVENOUS

## 2016-06-18 MED ORDER — IOPAMIDOL (ISOVUE-370) INJECTION 76%
INTRAVENOUS | Status: AC
  Filled 2016-06-18: qty 100

## 2016-06-18 MED ORDER — MIDAZOLAM HCL 2 MG/2ML IJ SOLN
INTRAMUSCULAR | Status: DC | PRN
  Administered 2016-06-18: 2 mg via INTRAVENOUS

## 2016-06-18 MED ORDER — FENTANYL CITRATE (PF) 100 MCG/2ML IJ SOLN
INTRAMUSCULAR | Status: AC
  Filled 2016-06-18: qty 2

## 2016-06-18 MED ORDER — SODIUM CHLORIDE 0.9% FLUSH: 3.0000 mL | INTRAVENOUS | Status: DC | PRN

## 2016-06-18 MED ORDER — SODIUM CHLORIDE 0.9% FLUSH: 3.0000 mL | Freq: Two times a day (BID) | INTRAVENOUS | Status: DC

## 2016-06-18 MED ORDER — METOPROLOL TARTRATE 25 MG PO TABS: 25.0000 mg | ORAL_TABLET | Freq: Two times a day (BID) | ORAL | 6 refills | Status: AC

## 2016-06-18 MED ORDER — HEPARIN (PORCINE) IN NACL 2-0.9 UNIT/ML-% IJ SOLN
INTRAMUSCULAR | Status: DC | PRN
  Administered 2016-06-18: 1000 mL

## 2016-06-18 MED ORDER — ATORVASTATIN CALCIUM 40 MG PO TABS: 40.0000 mg | ORAL_TABLET | Freq: Every day | ORAL | 6 refills | Status: AC

## 2016-06-18 MED ORDER — LIDOCAINE HCL (PF) 1 % IJ SOLN
INTRAMUSCULAR | Status: AC
  Filled 2016-06-18: qty 30

## 2016-06-18 MED FILL — Heparin Sodium (Porcine) 100 Unt/ML in Sodium Chloride 0.45%: INTRAMUSCULAR | Qty: 250 | Status: AC

## 2016-06-18 SURGICAL SUPPLY — 10 items

## 2016-06-18 NOTE — Interval H&P Note (Signed)
History and Physical Interval Note:  06/18/2016 12:40 PM  Victoria Lucas  has presented today for cardiac cath with the diagnosis of chest pain, abnormal stress test  The various methods of treatment have been discussed with the patient and family. After consideration of risks, benefits and other options for treatment, the patient has consented to  Procedure(s): Left Heart Cath and Coronary Angiography (N/A) as a surgical intervention .  The patient's history has been reviewed, patient examined, no change in status, stable for surgery.  I have reviewed the patient's chart and labs.  Questions were answered to the patient's satisfaction.    Cath Lab Visit (complete for each Cath Lab visit)  Clinical Evaluation Leading to the Procedure:   ACS: No.  Non-ACS:    Anginal Classification: CCS III  Anti-ischemic medical therapy: No Therapy  Non-Invasive Test Results: Intermediate-risk stress test findings: cardiac mortality 1-3%/year  Prior CABG: No previous CABG         Verne Carrow

## 2016-06-18 NOTE — Discharge Summary (Signed)
Discharge Summary    Patient ID: Victoria Lucas,  MRN: [id number], DOB/AGE: 11-15-1965 51 y.o.  Admit date: 06/15/2016 Discharge date: 06/18/2016  Primary Care Provider: Fara Chute W Primary Cardiologist: [facility])  Discharge Diagnoses    Active Problems:   Abnormal result of cardiovascular function study   Diabetes mellitus type 2, noninsulin dependent (HCC)   Obesity (BMI 30-39.9)   Hypertension, essential   Tobacco abuse   Unstable angina (HCC)   Bifascicular block   Allergies Allergies  Allergen Reactions  . Codeine Rash  . Penicillins Rash    Has patient had a PCN reaction causing immediate rash, facial/tongue/throat swelling, SOB or lightheadedness with hypotension: Yes Has patient had a PCN reaction causing severe rash involving mucus membranes or skin necrosis: No Has patient had a PCN reaction that required hospitalization No Has patient had a PCN reaction occurring within the last 10 years: Yes If all of the above answers are "NO", then may proceed with Cephalosporin use.   Marland Kitchen Propoxyphene   . Tamoxifen     Diagnostic Studies/Procedures    LHC: 06/18/16  Conclusion     Mid RCA lesion, 20 %stenosed.  Ost 2nd Diag to 2nd Diag lesion, 20 %stenosed.  The left ventricular systolic function is normal.  LV end diastolic pressure is normal.  The left ventricular ejection fraction is 50-55% by visual estimate.  There is no mitral valve regurgitation.   1. Mild non-obstructive CAD 2. Normal LV systolic function with subtle apical wall motion abnormality.    Recommendations: Medical management of mild CAD.   ____________   History of Present Illness     51 y/o female with h/o normal cath in 2007, HTN, T2DM and tobacco use, who initially presented to [facility] for evaluation for chest pain c/w unstable angina. She ruled out for MI with negative enzymes. Pt had a positive Myoview stress test and was transferred to [facility] for definitive LHC.     Stress test demonstrated moderate reversibility involving the anterior apical myocardium with mild left ventricular dilatation with mild anterior anteroseptal segment hypokinesis noted.   Pt states her CP started 4 days prior to admission. Intermittent SSC pressure radiating to her back with mild dyspnea. The pain typically occurred at rest and would last from 4-5 minutes with occasional radiation to her left arm.  Not associated with sweating but mild shortness of breath with it.  She has arrived at [facility] for definitive LHC. She was CP free and on IV heparin. She stated she gave up smoking 3 days prior after CP started. The only prescription medication she is on is Metformin. Stated her blood sugars usually run high.  Hospital Course     Consultants: None   Underwent LHC on 06/18/16 with non obstructive CAD with plans to continue with medical management. She was seen post cath, right radial site stable without bruising or hematoma. Follow up arranged in [place] office with plans to follow up with PCP for chronic illnesses. Questions answered prior to discharge. Encouraged close follow up with PCP for her DM as her Hgb A1c was 10.5 this admission.  _____________  Discharge Vitals Blood pressure (!) 102/59, pulse 79, temperature 98 F (36.7 C), temperature source Oral, resp. rate 18, height  (1.651 m), weight 195 lb (88.5 kg), SpO2 95 %.  Filed Weights   06/15/16 1836 06/18/16 0321  Weight: 196 lb 11.2 oz (89.2 kg) 195 lb (88.5 kg)    Labs & Radiologic Studies  CBC  Recent Labs  06/17/16 0241 06/18/16 0215  WBC 8.0 9.0  HGB 13.8 13.9  HCT 40.9 41.3  MCV 85.9 86.8  PLT 268 268   Basic Metabolic Panel  Recent Labs  06/17/16 0241 06/18/16 0215  NA 136 134*  K 3.7 3.7  CL 107 104  CO2 24 22  GLUCOSE 231* 279*  BUN 10 12  CREATININE 0.64 0.62  CALCIUM 8.7* 8.7*   Liver Function Tests No results for input(s): AST, ALT, ALKPHOS, BILITOT, PROT, ALBUMIN in the  last 72 hours. No results for input(s): LIPASE, AMYLASE in the last 72 hours. Cardiac Enzymes  Recent Labs  06/17/16 1148 06/17/16 1558 06/17/16 2216  TROPONINI <0.03 <0.03 <0.03   BNP Invalid input(s): POCBNP D-Dimer No results for input(s): DDIMER in the last 72 hours. Hemoglobin A1C  Recent Labs  06/15/16 2025  HGBA1C 10.5*   Fasting Lipid Panel  Recent Labs  06/16/16 0054  CHOL 166  HDL 47  LDLCALC 69  TRIG 248*  CHOLHDL 3.5   Thyroid Function Tests No results for input(s): TSH, T4TOTAL, T3FREE, THYROIDAB in the last 72 hours.  Invalid input(s): FREET3 _____________  No results found. Disposition   Pt is being discharged home today in good condition.  Follow-up Plans & Appointments    Follow-up Information    Jacolyn Reedy, PA-C Follow up on 06/21/2016.   Specialty:  Cardiology Why:  at 2pm for your follow up appt.  Contact information: [address] [place] [address] [phone]          Discharge Instructions    Call MD for:  redness, tenderness, or signs of infection (pain, swelling, redness, odor or green/yellow discharge around incision site)    Complete by:  As directed    Diet - low sodium heart healthy    Complete by:  As directed    Discharge instructions    Complete by:  As directed    Radial Site Care Refer to this sheet in the next few weeks. These instructions provide you with information on caring for yourself after your procedure. Your caregiver may also give you more specific instructions. Your treatment has been planned according to current medical practices, but problems sometimes occur. Call your caregiver if you have any problems or questions after your procedure. HOME CARE INSTRUCTIONS You may shower the day after the procedure.Remove the bandage (dressing) and gently wash the site with plain soap and water.Gently pat the site dry.  Do not apply powder or lotion to the site.  Do not submerge the affected site in  water for 3 to 5 days.  Inspect the site at least twice daily.  Do not flex or bend the affected arm for 24 hours.  No lifting over 5 pounds (2.3 kg) for 5 days after your procedure.  Do not drive home if you are discharged the same day of the procedure. Have someone else drive you.  You may drive 24 hours after the procedure unless otherwise instructed by your caregiver.  What to expect: Any bruising will usually fade within 1 to 2 weeks.  Blood that collects in the tissue (hematoma) may be painful to the touch. It should usually decrease in size and tenderness within 1 to 2 weeks.  SEEK IMMEDIATE MEDICAL CARE IF: You have unusual pain at the radial site.  You have redness, warmth, swelling, or pain at the radial site.  You have drainage (other than a small amount of blood on the dressing).  You have chills.  You have a fever or persistent symptoms for more than 72 hours.  You have a fever and your symptoms suddenly get worse.  Your arm becomes pale, cool, tingly, or numb.  You have heavy bleeding from the site. Hold pressure on the site.   Increase activity slowly    Complete by:  As directed       Discharge Medications   Current Discharge Medication List    START taking these medications   Details  aspirin EC 81 MG EC tablet Take 1 tablet (81 mg total) by mouth daily. Qty: 30 tablet    atorvastatin (LIPITOR) 40 MG tablet Take 1 tablet (40 mg total) by mouth daily at 6 PM. Qty: 30 tablet, Refills: 6    metoprolol tartrate (LOPRESSOR) 25 MG tablet Take 1 tablet (25 mg total) by mouth 2 (two) times daily. Qty: 60 tablet, Refills: 6      CONTINUE these medications which have NOT CHANGED   Details  metFORMIN (GLUMETZA) 1000 MG (MOD) 24 hr tablet Take 2,000 mg by mouth daily with breakfast.        Outstanding Labs/Studies   FLP/LFTs in 6-8 weeks  Duration of Discharge Encounter   Greater than 30 minutes including physician time.  Signed, Laverda Page  NP-C 06/18/2016, 4:07 PM   Agree with note by Laverda Page NP-C  OK for DC home. Non critical CAD. Med Rx. Will arrange OP F/U   Runell Gess, M.D., Jerrel Ivory [facility], Kathryne Eriksson [facility] [address] [place], [place]  [address]  [phone] 06/18/2016 8:01 PM

## 2016-06-18 NOTE — Research (Addendum)
OPTIMIZE Informed Consent   Subject Name: Victoria Lucas  Subject met inclusion and exclusion criteria.  The informed consent form, study requirements and expectations were reviewed with the subject and questions and concerns were addressed prior to the signing of the consent form.  The subject verbalized understanding of the trail requirements.  The subject agreed to participate in the OPTIMIZE trial and signed the informed consent.  The informed consent was obtained prior to performance of any protocol-specific procedures for the subject.  A copy of the signed informed consent was given to the subject and a copy was placed in the subject's medical record. Applicable if patient is randomized.  Hedrick,Sharena W 06/18/2016, 0900

## 2016-06-18 NOTE — Progress Notes (Signed)
Progress Note  Patient Name: DANIAL SISLEY Date of Encounter: 06/18/2016  Primary Cardiologist: New   Subjective   No chest pain, remains on IV nitro/heparin  Inpatient Medications    Scheduled Meds: . aspirin EC  81 mg Oral Daily  . atorvastatin  40 mg Oral q1800  . insulin aspart  0-9 Units Subcutaneous TID WC  . metoprolol tartrate  25 mg Oral BID  . sodium chloride flush  3 mL Intravenous Q12H   Continuous Infusions: . sodium chloride 1 mL/kg/hr (06/18/16 0952)  . heparin 1,750 Units/hr (06/18/16 0644)  . nitroGLYCERIN 5 mcg/min (06/17/16 1711)   PRN Meds: sodium chloride, acetaminophen, nitroGLYCERIN, sodium chloride flush   Vital Signs    Vitals:   06/17/16 1320 06/17/16 1710 06/17/16 2015 06/18/16 0321  BP: (!) 90/47 124/64 111/72 106/67  Pulse: 64 70 68 68  Resp: Temp: 98.4 F (36.9 C)  97.7 F (36.5 C) 97.8 F (36.6 C)  TempSrc: Oral  Oral Oral  SpO2: 96%  98% 99%  Weight:    195 lb (88.5 kg)  Height:        Intake/Output Summary (Last 24 hours) at 06/18/16 1055 Last data filed at 06/18/16 0800  Gross per 24 hour  Intake           894.06 ml  Output                0 ml  Net           894.06 ml   Filed Weights   06/15/16 1836 06/18/16 0321  Weight: 196 lb 11.2 oz (89.2 kg) 195 lb (88.5 kg)    Telemetry    SR - Personally Reviewed  ECG    SB with bifascicular block - Personally Reviewed  Physical Exam   General: Well developed, well nourished, female appearing in no acute distress. Head: Normocephalic, atraumatic.  Neck: Supple without bruits, JVD. Lungs:  Resp regular and unlabored, CTA. Heart: RRR, S1, S2, no S3, S4, or murmur; no rub. Abdomen: Soft, non-tender, non-distended with normoactive bowel sounds. No hepatomegaly. No rebound/guarding. No obvious abdominal masses. Extremities: No clubbing, cyanosis, edema. Distal pedal pulses are 2+ bilaterally. Neuro: Alert and oriented X 3. Moves all extremities  spontaneously. Psych: Normal affect.  Labs    Chemistry Recent Labs Lab 06/16/16 0054 06/17/16 0241 06/18/16 0215  NA 135 136 134*  K 3.7 3.7 3.7  CL 103 107 104  CO2 20* 24 22  GLUCOSE 287* 231* 279*  BUN CREATININE 0.74 0.64 0.62  CALCIUM 8.9 8.7* 8.7*  GFRNONAA >60 >60 >60  GFRAA >60 >60 >60  ANIONGAP Hematology Recent Labs Lab 06/16/16 0054 06/17/16 0241 06/18/16 0215  WBC 8.3 8.0 9.0  RBC 4.91 4.76 4.76  HGB 14.6 13.8 13.9  HCT 43.4 40.9 41.3  MCV 88.4 85.9 86.8  MCH 29.7 29.0 29.2  MCHC 33.6 33.7 33.7  RDW 14.1 14.0 14.4  PLT 239 268 268    Cardiac Enzymes Recent Labs Lab 06/17/16 1148 06/17/16 1558 06/17/16 2216  TROPONINI <0.03 <0.03 <0.03   No results for input(s): TROPIPOC in the last 168 hours.   BNPNo results for input(s): BNP, PROBNP in the last 168 hours.   DDimer No results for input(s): DDIMER in the last 168 hours.    Radiology    No results found.  Cardiac Studies   N/A  Patient Profile  51 y.o. female with PMH of HN, NIDD, HTN and tobacco use who presented to [facility] with chest pain and had a positive myoview. Transferred to [facility] for LHC.  Assessment & Plan    1. Unstable angina: Stress test at [facility] showed moderate reversibility involving the anterior apical myocardium with mild left ventricular dilatation with mild anterior anteroseptal segment hypokinesis noted. Continued on IV heparin. IV nitro was stopped yesterday due to hypotension, but now resumed. No chest pain.   -- on ASA, statin, BB -- planned for cardiac catheterization today.    2. NIDDM: Metformin held, on SSI. Hgb A1c 10.5 this admission. States she is followed by her PCP. Previous was 14 a few months ago.  3. Obesity:  Counseled on diet and exercise.   4. Tobacco Use  Signed, Laverda Page, NP  06/18/2016, 10:55 AM    Agree with note by Laverda Page NP-C  Patient with positive cardiac risk factors and recent positive  Myoview stress test [facility] with anteroapical ischemia admitted for left heart cath. She is on IV heparin and nitroglycerin. Her EKG shows no acute changes. Her enzymes are negative. She has had a heart cath 12 years ago without significant CAD. She's had recent progressive angina. For the problems include hypertension, diabetes out of control, and hyperlipidemia. She is scheduled for heart cath today.   Runell Gess, M.D., FACP, [facility], [facility] [address] [place], [place]  [address]  [phone] 06/18/2016 11:38 AM

## 2016-06-18 NOTE — H&P (View-Only) (Signed)
Progress Note  Patient Name: Victoria Lucas Date of Encounter: 06/18/2016  Primary Cardiologist: New   Subjective   No chest pain, remains on IV nitro/heparin  Inpatient Medications    Scheduled Meds: . aspirin EC  81 mg Oral Daily  . atorvastatin  40 mg Oral q1800  . insulin aspart  0-9 Units Subcutaneous TID WC  . metoprolol tartrate  25 mg Oral BID  . sodium chloride flush  3 mL Intravenous Q12H   Continuous Infusions: . sodium chloride 1 mL/kg/hr (06/18/16 0952)  . heparin 1,750 Units/hr (06/18/16 0644)  . nitroGLYCERIN 5 mcg/min (06/17/16 1711)   PRN Meds: sodium chloride, acetaminophen, nitroGLYCERIN, sodium chloride flush   Vital Signs    Vitals:   06/17/16 1320 06/17/16 1710 06/17/16 2015 06/18/16 0321  BP: (!) 90/47 124/64 111/72 106/67  Pulse: 64 70 68 68  Resp: Temp: 98.4 F (36.9 C)  97.7 F (36.5 C) 97.8 F (36.6 C)  TempSrc: Oral  Oral Oral  SpO2: 96%  98% 99%  Weight:    195 lb (88.5 kg)  Height:        Intake/Output Summary (Last 24 hours) at 06/18/16 1055 Last data filed at 06/18/16 0800  Gross per 24 hour  Intake           894.06 ml  Output                0 ml  Net           894.06 ml   Filed Weights   06/15/16 1836 06/18/16 0321  Weight: 196 lb 11.2 oz (89.2 kg) 195 lb (88.5 kg)    Telemetry    SR - Personally Reviewed  ECG    SB with bifascicular block - Personally Reviewed  Physical Exam   General: Well developed, well nourished, female appearing in no acute distress. Head: Normocephalic, atraumatic.  Neck: Supple without bruits, JVD. Lungs:  Resp regular and unlabored, CTA. Heart: RRR, S1, S2, no S3, S4, or murmur; no rub. Abdomen: Soft, non-tender, non-distended with normoactive bowel sounds. No hepatomegaly. No rebound/guarding. No obvious abdominal masses. Extremities: No clubbing, cyanosis, edema. Distal pedal pulses are 2+ bilaterally. Neuro: Alert and oriented X 3. Moves all extremities  spontaneously. Psych: Normal affect.  Labs    Chemistry Recent Labs Lab 06/16/16 0054 06/17/16 0241 06/18/16 0215  NA 135 136 134*  K 3.7 3.7 3.7  CL 103 107 104  CO2 20* 24 22  GLUCOSE 287* 231* 279*  BUN CREATININE 0.74 0.64 0.62  CALCIUM 8.9 8.7* 8.7*  GFRNONAA >60 >60 >60  GFRAA >60 >60 >60  ANIONGAP Hematology Recent Labs Lab 06/16/16 0054 06/17/16 0241 06/18/16 0215  WBC 8.3 8.0 9.0  RBC 4.91 4.76 4.76  HGB 14.6 13.8 13.9  HCT 43.4 40.9 41.3  MCV 88.4 85.9 86.8  MCH 29.7 29.0 29.2  MCHC 33.6 33.7 33.7  RDW 14.1 14.0 14.4  PLT 239 268 268    Cardiac Enzymes Recent Labs Lab 06/17/16 1148 06/17/16 1558 06/17/16 2216  TROPONINI <0.03 <0.03 <0.03   No results for input(s): TROPIPOC in the last 168 hours.   BNPNo results for input(s): BNP, PROBNP in the last 168 hours.   DDimer No results for input(s): DDIMER in the last 168 hours.    Radiology    No results found.  Cardiac Studies   N/A  Patient Profile  51 y.o. female with PMH of HN, NIDD, HTN and tobacco use who presented to [facility] with chest pain and had a positive myoview. Transferred to [facility] for LHC.  Assessment & Plan    1. Unstable angina: Stress test at [facility] showed moderate reversibility involving the anterior apical myocardium with mild left ventricular dilatation with mild anterior anteroseptal segment hypokinesis noted. Continued on IV heparin. IV nitro was stopped yesterday due to hypotension, but now resumed. No chest pain.   -- on ASA, statin, BB -- planned for cardiac catheterization today.    2. NIDDM: Metformin held, on SSI. Hgb A1c 10.5 this admission. States she is followed by her PCP. Previous was 14 a few months ago.  3. Obesity:  Counseled on diet and exercise.   4. Tobacco Use  Signed, Lindsay Roberts, NP  06/18/2016, 10:55 AM    Agree with note by Lindsay Roberts NP-C  Patient with positive cardiac risk factors and recent positive  Myoview stress test [facility] with anteroapical ischemia admitted for left heart cath. She is on IV heparin and nitroglycerin. Her EKG shows no acute changes. Her enzymes are negative. She has had a heart cath 12 years ago without significant CAD. She's had recent progressive angina. For the problems include hypertension, diabetes out of control, and hyperlipidemia. She is scheduled for heart cath today.   Allister Lessley J. Almeda Ezra, M.D., FACP, FACC, FAHA, FSCAI [facility] [address] [place], [place]  [address]  [phone] 06/18/2016 11:38 AM  

## 2016-06-18 NOTE — Progress Notes (Signed)
ANTICOAGULATION CONSULT NOTE - Follow Up Consult  Pharmacy Consult for Heparin Indication: chest pain/ACS  Allergies  Allergen Reactions  . Codeine Rash  . Penicillins Rash    Has patient had a PCN reaction causing immediate rash, facial/tongue/throat swelling, SOB or lightheadedness with hypotension: Yes Has patient had a PCN reaction causing severe rash involving mucus membranes or skin necrosis: No Has patient had a PCN reaction that required hospitalization No Has patient had a PCN reaction occurring within the last 10 years: Yes If all of the above answers are "NO", then may proceed with Cephalosporin use.   Marland Kitchen Propoxyphene   . Tamoxifen     Patient Measurements: Height:  (165.1 cm) Weight: 195 lb (88.5 kg) IBW/kg (Calculated) : 57 Heparin Dosing Weight:  76.6 kg  Vital Signs: Temp: 97.8 F (36.6 C) (04/09 0321) Temp Source: Oral (04/09 0321) BP: 106/67 (04/09 0321) Pulse Rate: 68 (04/09 0321)  Labs:  Recent Labs  06/16/16 0054 06/16/16 1914 06/17/16 0241 06/17/16 1148 06/17/16 1558 06/17/16 2216 06/18/16 0215  HGB 14.6  --  13.8  --   --   --  13.9  HCT 43.4  --  40.9  --   --   --  41.3  PLT 239  --  268  --   --   --  268  HEPARINUNFRC 0.16* 0.54 0.39  --   --   --  0.23*  CREATININE 0.74  --  0.64  --   --   --  0.62  TROPONINI  --   --   --  <0.03 <0.03 <0.03  --     Estimated Creatinine Clearance: 91.4 mL/min (by C-G formula based on SCr of 0.62 mg/dL).  Assessment: 50 yof initially presented to [facility] for CP, UA. She ruled out for MI with negative enzymes. Pt had a positive Myoview stress test and was transferred to [facility] for definitive LHC - planned for Monday. Heparin level now down to subtherapeutic (0.23) on gtt at 1600 units/hr. CBC stable. No issues with line or bleeding reported per RN.   Goal of Therapy:  Heparin level 0.3-0.7 units/ml Monitor platelets by anticoagulation protocol: Yes   Plan:  Increase heparin to 1750  units/hr Will f/u post cath  Christoper Fabian, PharmD, BCPS Clinical pharmacist, pager [phone] 06/18/2016,6:36 AM

## 2016-06-27 ENCOUNTER — Ambulatory Visit (INDEPENDENT_AMBULATORY_CARE_PROVIDER_SITE_OTHER): Payer: 59 | Admitting: Physician Assistant

## 2016-06-27 ENCOUNTER — Encounter: Payer: Self-pay | Admitting: Physician Assistant

## 2016-06-27 VITALS — BP 124/68 | HR 88 | Ht 65.0 in | Wt 198.0 lb

## 2016-06-27 DIAGNOSIS — E119 Type 2 diabetes mellitus without complications: Secondary | ICD-10-CM

## 2016-06-27 DIAGNOSIS — Z720 Tobacco use: Secondary | ICD-10-CM

## 2016-06-27 DIAGNOSIS — I10 Essential (primary) hypertension: Secondary | ICD-10-CM

## 2016-06-27 DIAGNOSIS — E669 Obesity, unspecified: Secondary | ICD-10-CM | POA: Diagnosis not present

## 2016-06-27 DIAGNOSIS — R079 Chest pain, unspecified: Secondary | ICD-10-CM

## 2016-06-27 NOTE — Progress Notes (Signed)
Cardiology Office Note    Date:  06/27/2016   ID:  [name], DOB 23-Dec-1965, MRN [id number]  PCP:  Estanislado Pandy, MD  Cardiologist: new to [facility]  Chief Complaint  Patient presents with  . Follow-up    History of Present Illness:  Victoria Lucas is a 51 y.o. female with history of normal cath in 2007, hypertension, DM, tobacco abuse who presented to [facility] with chest pain and ruled out for an MI. She then had a positive Myoview test with moderate reversibility in the anterior apical myocardium mild LV dilatation with mid anterior anterior septal segment hypokinesis. She was transferred to [facility] for cardiac catheterization. This was performed on 06/18/16 and showed a 20% mid RCA in 20% ostial second diagonal with normal LV function LVEF 50-55%.  Patient comes in today for post cath follow-up. She was started on anti-inflammatory by her primary care for the chest pain but hasn't picked it up yet. She is wondering if she should stay on metoprolol. She had hypertension in the past and was on lisinopril but this was stopped after she lost 80 pounds. She was smoking 2 packs of cigarettes daily but is down to half a pack daily.    Past Medical History:  Diagnosis Date  . Abnormal nuclear stress test   . Heart murmur    "when I was born; cleared in my ?early teens" (06/15/2016)  . HTN (hypertension)   . Obesity (BMI 30-39.9)   . Pneumonia 01/2015; 02/2015; 01/2016; 02/2016  . Situational anxiety   . Tobacco abuse   . Type II diabetes mellitus (HCC)     Past Surgical History:  Procedure Laterality Date  . CARDIAC CATHETERIZATION  06/2005   Hattie Perch 07/25/2010  . CESAREAN SECTION  2001  . LAPAROSCOPIC CHOLECYSTECTOMY  ~ 2003  . LEFT HEART CATH AND CORONARY ANGIOGRAPHY N/A 06/18/2016   Procedure: Left Heart Cath and Coronary Angiography;  Surgeon: Kathleene Hazel, MD;  Location: [facility] INVASIVE CV LAB;  Service: Cardiovascular;  Laterality: N/A;  . SHOULDER OPEN ROTATOR CUFF  REPAIR Right 2011    Current Medications: Outpatient Medications Prior to Visit  Medication Sig Dispense Refill  . aspirin EC 81 MG EC tablet Take 1 tablet (81 mg total) by mouth daily. 30 tablet   . atorvastatin (LIPITOR) 40 MG tablet Take 1 tablet (40 mg total) by mouth daily at 6 PM. 30 tablet 6  . metFORMIN (GLUMETZA) 1000 MG (MOD) 24 hr tablet Take 2,000 mg by mouth daily with breakfast.    . metoprolol tartrate (LOPRESSOR) 25 MG tablet Take 1 tablet (25 mg total) by mouth 2 (two) times daily. 60 tablet 6   No facility-administered medications prior to visit.      Allergies:   Codeine; Penicillins; Propoxyphene; and Tamoxifen   Social History   Social History  . Marital status: Married    Spouse name: N/A  . Number of children: N/A  . Years of education: N/A   Social History Main Topics  . Smoking status: Current Every Day Smoker    Packs/day: 0.50    Years: 37.00    Types: Cigarettes  . Smokeless tobacco: Never Used  . Alcohol use Yes     Comment: 06/15/2016 "might have a drink w/friends once/month"  . Drug use: No  . Sexual activity: Yes   Other Topics Concern  . None   Social History Narrative  . None     Family History:  The patient's  family history includes Atrial fibrillation in her father; Diabetes in her father and mother; Kidney disease in her mother; Valvular heart disease in her paternal grandfather.   ROS:   Please see the history of present illness.    Review of Systems  Constitution: Negative.  HENT: Negative.   Eyes: Negative.   Cardiovascular: Positive for chest pain.  Respiratory: Negative.   Hematologic/Lymphatic: Negative.   Musculoskeletal: Negative.  Negative for joint pain.  Gastrointestinal: Negative.   Genitourinary: Negative.   Neurological: Negative.    All other systems reviewed and are negative.   PHYSICAL EXAM:   VS:  BP 124/68   Pulse 88   Ht  (1.651 m)   Wt 198 lb (89.8 kg)   SpO2 98%   BMI 32.95 kg/m     Physical Exam  GEN: Obese, in no acute distress  Neck: no JVD, carotid bruits, or masses Cardiac:RRR; no murmurs, rubs, or gallops  Respiratory:  clear to auscultation bilaterally, normal work of breathing GI: soft, nontender, nondistended, + BS Ext: Right arm at cath site without hematoma or hemorrhage, lower extremities without cyanosis, clubbing, or edema, Good distal pulses bilaterally Psych: euthymic mood, full affect  Wt Readings from Last 3 Encounters:  06/27/16 198 lb (89.8 kg)  06/18/16 195 lb (88.5 kg)      Studies/Labs Reviewed:   EKG:  EKG is not ordered today.    Recent Labs: 06/18/2016: BUN 12; Creatinine, Ser 0.62; Hemoglobin 13.9; Platelets 268; Potassium 3.7; Sodium 134   Lipid Panel    Component Value Date/Time   CHOL 166 06/16/2016 0054   TRIG 248 (H) 06/16/2016 0054   HDL 47 06/16/2016 0054   CHOLHDL 3.5 06/16/2016 0054   VLDL 50 (H) 06/16/2016 0054   LDLCALC 69 06/16/2016 0054    Additional studies/ records that were reviewed today include:   LHC: 06/18/16   Conclusion       Mid RCA lesion, 20 %stenosed.  Ost 2nd Diag to 2nd Diag lesion, 20 %stenosed.  The left ventricular systolic function is normal.  LV end diastolic pressure is normal.  The left ventricular ejection fraction is 50-55% by visual estimate.  There is no mitral valve regurgitation.   1. Mild non-obstructive CAD 2. Normal LV systolic function with subtle apical wall motion abnormality.      Recommendations: Medical management of mild CAD.   ____________    ASSESSMENT:    1. Chest pain, unspecified type   2. Hypertension, essential   3. Diabetes mellitus type 2, noninsulin dependent (HCC)   4. Tobacco abuse   5. Obesity (BMI 30-39.9)      PLAN:  In order of problems listed above:  Chest pain with abnormal exercise Myoview but nonobstructive disease on cardiac catheterization. Now being treated for inflammation by primary M.D. Risk factor modification  recommended. Follow-up with cardiology when necessary  Essential hypertension patient was started on metoprolol hospital. Was previously on lisinopril but this was stopped when she lost 80 pounds. She has a cuff at home and we'll check her blood pressures regularly and discuss with primary M.D.  Diabetes mellitus hemoglobin A1c was over 10 in the hospital. Needs tighter control  Tobacco abuse smoking cessation discussed. Patient has cut back from 2 packs per day to half a pack daily and trying to quit.  Obesity diet and exercise recommended.    Medication Adjustments/Labs and Tests Ordered: Current medicines are reviewed at length with the patient today.  Concerns regarding medicines are outlined  above.  Medication changes, Labs and Tests ordered today are listed in the Patient Instructions below. Patient Instructions  Your physician recommends that you schedule a follow-up appointment As Needed.   Your physician recommends that you continue on your current medications as directed. Please refer to the Current Medication list given to you today.  If you need a refill on your cardiac medications before your next appointment, please call your pharmacy.  Thank you for choosing [facility]!       Signed, Jacolyn Reedy, PA-C  06/27/2016 2:20 PM    [facility] [address] [place], [place], [place]  [address] Phone: [phone]; Fax: [phone]

## 2016-06-27 NOTE — Patient Instructions (Signed)
Your physician recommends that you schedule a follow-up appointment As Needed  Your physician recommends that you continue on your current medications as directed. Please refer to the Current Medication list given to you today.  If you need a refill on your cardiac medications before your next appointment, please call your pharmacy.  Thank you for choosing [facility]!    

## 2018-03-14 DIAGNOSIS — R1012 Left upper quadrant pain: Secondary | ICD-10-CM | POA: Diagnosis not present

## 2018-03-14 DIAGNOSIS — M545 Low back pain: Secondary | ICD-10-CM | POA: Diagnosis not present

## 2018-03-14 DIAGNOSIS — Z6828 Body mass index (BMI) 28.0-28.9, adult: Secondary | ICD-10-CM | POA: Diagnosis not present

## 2018-03-14 DIAGNOSIS — M25551 Pain in right hip: Secondary | ICD-10-CM | POA: Diagnosis not present

## 2018-03-20 DIAGNOSIS — R1010 Upper abdominal pain, unspecified: Secondary | ICD-10-CM | POA: Diagnosis not present

## 2018-04-10 ENCOUNTER — Encounter: Payer: Self-pay | Admitting: Physician Assistant

## 2018-04-11 ENCOUNTER — Emergency Department (HOSPITAL_COMMUNITY): Payer: BLUE CROSS/BLUE SHIELD

## 2018-04-11 ENCOUNTER — Encounter (HOSPITAL_COMMUNITY): Payer: Self-pay | Admitting: Emergency Medicine

## 2018-04-11 ENCOUNTER — Emergency Department (HOSPITAL_COMMUNITY)
Admission: EM | Admit: 2018-04-11 | Discharge: 2018-04-11 | Disposition: A | Discharge status: Home / Self Care | Payer: BLUE CROSS/BLUE SHIELD | Attending: Emergency Medicine | Admitting: Emergency Medicine

## 2018-04-11 ENCOUNTER — Other Ambulatory Visit: Payer: Self-pay

## 2018-04-11 DIAGNOSIS — E119 Type 2 diabetes mellitus without complications: Secondary | ICD-10-CM | POA: Diagnosis not present

## 2018-04-11 DIAGNOSIS — Z7982 Long term (current) use of aspirin: Secondary | ICD-10-CM | POA: Insufficient documentation

## 2018-04-11 DIAGNOSIS — I10 Essential (primary) hypertension: Secondary | ICD-10-CM | POA: Insufficient documentation

## 2018-04-11 DIAGNOSIS — Z7984 Long term (current) use of oral hypoglycemic drugs: Secondary | ICD-10-CM | POA: Diagnosis not present

## 2018-04-11 DIAGNOSIS — Z79899 Other long term (current) drug therapy: Secondary | ICD-10-CM | POA: Diagnosis not present

## 2018-04-11 DIAGNOSIS — R1032 Left lower quadrant pain: Secondary | ICD-10-CM | POA: Diagnosis not present

## 2018-04-11 DIAGNOSIS — F17210 Nicotine dependence, cigarettes, uncomplicated: Secondary | ICD-10-CM | POA: Insufficient documentation

## 2018-04-11 DIAGNOSIS — R109 Unspecified abdominal pain: Secondary | ICD-10-CM | POA: Diagnosis not present

## 2018-04-11 DIAGNOSIS — R1013 Epigastric pain: Secondary | ICD-10-CM | POA: Diagnosis not present

## 2018-04-11 LAB — CBC WITH DIFFERENTIAL/PLATELET
Abs Immature Granulocytes: 0.03 10*3/uL (ref 0.00–0.07)
BASOS PCT: 0 %
Basophils Absolute: 0 10*3/uL (ref 0.0–0.1)
EOS ABS: 0.1 10*3/uL (ref 0.0–0.5)
EOS PCT: 1 %
HCT: 43.9 % (ref 36.0–46.0)
Hemoglobin: 14.4 g/dL (ref 12.0–15.0)
Immature Granulocytes: 0 %
Lymphocytes Relative: 36 %
Lymphs Abs: 3.3 10*3/uL (ref 0.7–4.0)
MCH: 28.9 pg (ref 26.0–34.0)
MCHC: 32.8 g/dL (ref 30.0–36.0)
MCV: 88.2 fL (ref 80.0–100.0)
MONO ABS: 0.7 10*3/uL (ref 0.1–1.0)
Monocytes Relative: 8 %
NEUTROS ABS: 5.1 10*3/uL (ref 1.7–7.7)
Neutrophils Relative %: 55 %
PLATELETS: 302 10*3/uL (ref 150–400)
RBC: 4.98 MIL/uL (ref 3.87–5.11)
RDW: 13.1 % (ref 11.5–15.5)
WBC: 9.2 10*3/uL (ref 4.0–10.5)
nRBC: 0 % (ref 0.0–0.2)

## 2018-04-11 LAB — COMPREHENSIVE METABOLIC PANEL
ALBUMIN: 3.3 g/dL — AB (ref 3.5–5.0)
ALT: 13 U/L (ref 0–44)
AST: 12 U/L — ABNORMAL LOW (ref 15–41)
Alkaline Phosphatase: 84 U/L (ref 38–126)
Anion gap: 9 (ref 5–15)
BUN: 7 mg/dL (ref 6–20)
CALCIUM: 8.5 mg/dL — AB (ref 8.9–10.3)
CHLORIDE: 105 mmol/L (ref 98–111)
CO2: 23 mmol/L (ref 22–32)
Creatinine, Ser: 0.62 mg/dL (ref 0.44–1.00)
GFR calc Af Amer: >60 mL/min (ref >60)
GFR calc non Af Amer: >60 mL/min (ref >60)
Glucose, Bld: 180 mg/dL — ABNORMAL HIGH (ref 70–99)
POTASSIUM: 3.7 mmol/L (ref 3.5–5.1)
SODIUM: 137 mmol/L (ref 135–145)
TOTAL PROTEIN: 6.4 g/dL — AB (ref 6.5–8.1)
Total Bilirubin: 0.5 mg/dL (ref 0.3–1.2)

## 2018-04-11 LAB — URINALYSIS, ROUTINE W REFLEX MICROSCOPIC
BILIRUBIN URINE: NEGATIVE
GLUCOSE, UA: 50 mg/dL — AB
Hgb urine dipstick: NEGATIVE
KETONES UR: NEGATIVE mg/dL
Leukocytes, UA: NEGATIVE
Nitrite: NEGATIVE
PH: 6 (ref 5.0–8.0)
Protein, ur: NEGATIVE mg/dL
Specific Gravity, Urine: >1.046 — ABNORMAL HIGH (ref 1.005–1.030)

## 2018-04-11 LAB — LIPASE, BLOOD: LIPASE: 38 U/L (ref 11–51)

## 2018-04-11 LAB — I-STAT BETA HCG BLOOD, ED (MC, WL, AP ONLY)

## 2018-04-11 MED ORDER — ONDANSETRON HCL 4 MG/2ML IJ SOLN
4.0000 mg | Freq: Once | INTRAMUSCULAR | Status: AC
  Administered 2018-04-11: 4 mg via INTRAVENOUS
  Filled 2018-04-11: qty 2

## 2018-04-11 MED ORDER — SUCRALFATE 1 G PO TABS: 1.0000 g | ORAL_TABLET | Freq: Three times a day (TID) | ORAL | 0 refills | Status: AC

## 2018-04-11 MED ORDER — LIDOCAINE VISCOUS HCL 2 % MT SOLN
15.0000 mL | Freq: Once | OROMUCOSAL | Status: AC
  Administered 2018-04-11: 15 mL via ORAL
  Filled 2018-04-11: qty 15

## 2018-04-11 MED ORDER — SODIUM CHLORIDE 0.9 % IV BOLUS
500.0000 mL | Freq: Once | INTRAVENOUS | Status: AC
  Administered 2018-04-11: 500 mL via INTRAVENOUS

## 2018-04-11 MED ORDER — IOHEXOL 300 MG/ML  SOLN
100.0000 mL | Freq: Once | INTRAMUSCULAR | Status: AC | PRN
  Administered 2018-04-11: 100 mL via INTRAVENOUS

## 2018-04-11 MED ORDER — PANTOPRAZOLE SODIUM 20 MG PO TBEC: 20.0000 mg | DELAYED_RELEASE_TABLET | Freq: Every day | ORAL | 0 refills | Status: DC

## 2018-04-11 MED ORDER — ALUM & MAG HYDROXIDE-SIMETH 200-200-20 MG/5ML PO SUSP
30.0000 mL | Freq: Once | ORAL | Status: AC
  Administered 2018-04-11: 30 mL via ORAL
  Filled 2018-04-11: qty 30

## 2018-04-11 MED ORDER — MORPHINE SULFATE (PF) 2 MG/ML IV SOLN
2.0000 mg | Freq: Once | INTRAVENOUS | Status: AC
  Administered 2018-04-11: 2 mg via INTRAVENOUS
  Filled 2018-04-11: qty 1

## 2018-04-11 MED ORDER — MORPHINE SULFATE (PF) 4 MG/ML IV SOLN
4.0000 mg | Freq: Once | INTRAVENOUS | Status: AC
  Administered 2018-04-11: 4 mg via INTRAVENOUS
  Filled 2018-04-11: qty 1

## 2018-04-11 MED ORDER — PANTOPRAZOLE SODIUM 40 MG PO TBEC
40.0000 mg | DELAYED_RELEASE_TABLET | Freq: Once | ORAL | Status: AC
  Administered 2018-04-11: 40 mg via ORAL
  Filled 2018-04-11: qty 1

## 2018-04-11 NOTE — ED Notes (Signed)
Pt is aware a urine sample is needed and will provide one when able.

## 2018-04-11 NOTE — ED Triage Notes (Addendum)
Flank pain for months. PCP has her see GI next wedns. Pain became worse today. Left sided radiating into groin. Pt reports pain gets worse with eating. She reports she lost 20 lbs in 6 weeks due to not eating much. Has had [place] that was "normal" but no CT. Pain is chronic and improves with a warm bath.

## 2018-04-11 NOTE — ED Notes (Signed)
Patient transported to CT 

## 2018-04-11 NOTE — ED Provider Notes (Signed)
[facility] EMERGENCY DEPARTMENT Provider Note   CSN: [id number] Arrival date & time: 04/11/18  1441     History   Chief Complaint Chief Complaint  Patient presents with  . Flank Pain    HPI Victoria Lucas is a 53 y.o. female with a past medical history of HTN, DM2, bifasciular block block, tobacco abuse, who presents today for evaluation of left sided lower back pain radiating into LLQ. She reports that her primary care doctor has ordered labs, and ultrasound, and those have been unremarkable.  She reports that the pain became worse today while she was driving to work.  The pain gets worse with any p.o. intake including water.  She denies nausea vomiting or diarrhea.  She does report that she has lost approximately 20 pounds over the past 6 weeks which she thinks is due to not eating as much.  Her pain is made better with a warm bath and a heating pad temporarily.      HPI  Past Medical History:  Diagnosis Date  . Abnormal nuclear stress test   . Heart murmur    "when I was born; cleared in my ?early teens" (06/15/2016)  . HTN (hypertension)   . Obesity (BMI 30-39.9)   . Pneumonia 01/2015; 02/2015; 01/2016; 02/2016  . Situational anxiety   . Tobacco abuse   . Type II diabetes mellitus [facility])     Patient Active Problem List   Diagnosis Date Noted  . Bifascicular block   . Chest pain 06/15/2016  . Abnormal result of cardiovascular function study 06/15/2016  . Diabetes mellitus type 2, noninsulin dependent (HCC) 06/15/2016  . Hypertension, essential 06/15/2016  . Tobacco abuse 06/15/2016  . Unstable angina (HCC) 06/15/2016  . Obesity (BMI 30-39.9)     Past Surgical History:  Procedure Laterality Date  . CARDIAC CATHETERIZATION  06/2005   Hattie Perch 07/25/2010  . CESAREAN SECTION  2001  . LAPAROSCOPIC CHOLECYSTECTOMY  ~ 2003  . LEFT HEART CATH AND CORONARY ANGIOGRAPHY N/A 06/18/2016   Procedure: Left Heart Cath and Coronary Angiography;  Surgeon:  Kathleene Hazel, MD;  Location: [facility] INVASIVE CV LAB;  Service: Cardiovascular;  Laterality: N/A;  . SHOULDER OPEN ROTATOR CUFF REPAIR Right 2011     OB History   No obstetric history on file.      Home Medications    Prior to Admission medications   Medication Sig Start Date End Date Taking? Authorizing Provider  ALPRAZolam Prudy Feeler) 0.5 MG tablet Take 0.5 mg by mouth 2 (two) times daily as needed for anxiety.    [provider]  aspirin EC 81 MG EC tablet Take 1 tablet (81 mg total) by mouth daily. 06/19/16   Arty Baumgartner, NP  atorvastatin (LIPITOR) 40 MG tablet Take 1 tablet (40 mg total) by mouth daily at 6 PM. 06/18/16   Arty Baumgartner, NP  metFORMIN (GLUMETZA) 1000 MG (MOD) 24 hr tablet Take 2,000 mg by mouth daily with breakfast.    [provider]  metoprolol tartrate (LOPRESSOR) 25 MG tablet Take 1 tablet (25 mg total) by mouth 2 (two) times daily. 06/18/16   Arty Baumgartner, NP  pantoprazole (PROTONIX) 20 MG tablet Take 1 tablet (20 mg total) by mouth daily for 14 days. 04/11/18 04/25/18  Cristina Gong, PA-C  sucralfate (CARAFATE) 1 g tablet Take 1 tablet (1 g total) by mouth 4 (four) times daily -  with meals and at bedtime for 14 days. 04/11/18 04/25/18  Cristina Gong, PA-C    Family History Family History  Problem Relation Age of Onset  . Valvular heart disease Paternal Grandfather   . Diabetes Mother   . Kidney disease Mother   . Diabetes Father   . Atrial fibrillation Father     Social History Social History   Tobacco Use  . Smoking status: Current Every Day Smoker    Packs/day: 0.50    Years: 37.00    Pack years: 18.50    Types: Cigarettes  . Smokeless tobacco: Never Used  Substance Use Topics  . Alcohol use: Yes    Comment: 06/15/2016 "might have a drink w/friends once/month"  . Drug use: No     Allergies   Codeine; Penicillins; Propoxyphene; and Tamoxifen   Review of Systems Review of Systems    Constitutional: Positive for appetite change and unexpected weight change. Negative for chills and fever.  HENT: Negative for congestion.   Respiratory: Negative for cough and shortness of breath.   Cardiovascular: Negative for chest pain.  Gastrointestinal: Positive for abdominal pain. Negative for abdominal distention, blood in stool, diarrhea, nausea and vomiting.  Genitourinary: Positive for flank pain. Negative for dysuria, hematuria, urgency, vaginal bleeding, vaginal discharge and vaginal pain.  Musculoskeletal: Positive for back pain. Negative for myalgias, neck pain and neck stiffness.  Skin: Negative for color change and rash.  Neurological: Negative for weakness and numbness.  All other systems reviewed and are negative.    Physical Exam Updated Vital Signs BP 119/74 (BP Location: Right Arm)   Pulse 83   Temp 97.9 F (36.6 C) (Oral)   Resp 16   SpO2 98%   Physical Exam Vitals signs and nursing note reviewed.  Constitutional:      General: She is not in acute distress.    Appearance: She is well-developed.  HENT:     Head: Normocephalic and atraumatic.  Eyes:     Conjunctiva/sclera: Conjunctivae normal.  Neck:     Musculoskeletal: Normal range of motion and neck supple. No neck rigidity.  Cardiovascular:     Rate and Rhythm: Normal rate and regular rhythm.     Heart sounds: Normal heart sounds. No murmur.  Pulmonary:     Effort: Pulmonary effort is normal. No respiratory distress.     Breath sounds: Normal breath sounds.  Abdominal:     General: Bowel sounds are normal. There is no distension.     Palpations: Abdomen is soft. There is no mass.     Tenderness: There is abdominal tenderness. There is no right CVA tenderness, left CVA tenderness or guarding.     Comments: TTP worse in epigastric, RUQ, then RLQ.  No other TTP.    Musculoskeletal:     Comments: L spine No midline TTP.  There is diffuse TTP over paraspinal left sided L spine muscles.    Skin:     General: Skin is warm and dry.  Neurological:     General: No focal deficit present.     Mental Status: She is alert.     Cranial Nerves: No cranial nerve deficit.  Psychiatric:        Mood and Affect: Mood normal.        Behavior: Behavior normal.      ED Treatments / Results  Labs (all labs ordered are listed, but only abnormal results are displayed) Labs Reviewed  COMPREHENSIVE METABOLIC PANEL - Abnormal; Notable for the following components:      Result Value   Glucose,  Bld 180 (*)    Calcium 8.5 (*)    Total Protein 6.4 (*)    Albumin 3.3 (*)    AST 12 (*)    All other components within normal limits  URINALYSIS, ROUTINE W REFLEX MICROSCOPIC - Abnormal; Notable for the following components:   APPearance HAZY (*)    Specific Gravity, Urine >1.046 (*)    Glucose, UA 50 (*)    All other components within normal limits  LIPASE, BLOOD  CBC WITH DIFFERENTIAL/PLATELET  I-STAT BETA HCG BLOOD, ED ([facility], [facility], [facility] ONLY)    EKG None  Radiology Ct Abdomen Pelvis W Contrast  Result Date: 04/11/2018 CLINICAL DATA:  Left flank pain for several months, worse today. EXAM: CT ABDOMEN AND PELVIS WITH CONTRAST TECHNIQUE: Multidetector CT imaging of the abdomen and pelvis was performed using the standard protocol following bolus administration of intravenous contrast. CONTRAST:  100 mL OMNIPAQUE IOHEXOL 300 MG/ML  SOLN COMPARISON:  CT abdomen and pelvis 12/25/2012. FINDINGS: Lower chest: Mild dependent atelectasis left lung base noted. Small focus of linear scar in the right middle lobe is unchanged. No pleural or pericardial effusion. Hepatobiliary: Status post cholecystectomy. Liver and biliary tree are unremarkable. Pancreas: Unremarkable. No pancreatic ductal dilatation or surrounding inflammatory changes. Spleen: Normal in size without focal abnormality. Adrenals/Urinary Tract: Benign left adrenal adenoma is noted. Right adrenal gland appears. Kidneys, ureters and urinary bladder appear  normal. Stomach/Bowel: Stomach is within normal limits. Appendix appears normal. No evidence of bowel wall thickening, distention, or inflammatory changes. Vascular/Lymphatic: Aortic atherosclerosis. No enlarged abdominal or pelvic lymph nodes. Reproductive: Uterus and bilateral adnexa are unremarkable. Other: None. Musculoskeletal: Negative. IMPRESSION: No acute abnormality abdomen or pelvis. No finding to explain the patient's symptoms. Atherosclerosis. Electronically Signed   By: Drusilla Kannerhomas  Dalessio M.D.   On: 04/11/2018 17:46    Procedures Procedures (including critical care time)  Medications Ordered in ED Medications  morphine 4 MG/ML injection 4 mg (4 mg Intravenous Given 04/11/18 1612)  ondansetron (ZOFRAN) injection 4 mg (4 mg Intravenous Given 04/11/18 1612)  alum & mag hydroxide-simeth (MAALOX/MYLANTA) 200-200-20 MG/5ML suspension 30 mL (30 mLs Oral Given 04/11/18 1613)    And  lidocaine (XYLOCAINE) 2 % viscous mouth solution 15 mL (15 mLs Oral Given 04/11/18 1612)  iohexol (OMNIPAQUE) 300 MG/ML solution 100 mL (100 mLs Intravenous Contrast Given 04/11/18 1735)  sodium chloride 0.9 % bolus 500 mL (0 mLs Intravenous Stopped 04/11/18 2027)  pantoprazole (PROTONIX) EC tablet 40 mg (40 mg Oral Given 04/11/18 1923)  morphine 2 MG/ML injection 2 mg (2 mg Intravenous Given 04/11/18 2021)     Initial Impression / Assessment and Plan / ED Course  I have reviewed the triage vital signs and the nursing notes.  Pertinent labs & imaging results that were available during my care of the patient were reviewed by me and considered in my medical decision making (see chart for details).  Clinical Course as of Apr 11 2257  Fri Apr 11, 2018  1955 Patient reevaluated, she says her pain is elevating again.  Will give Percocet.  She was offered time to stay and evaluate response after Percocet or discharge and she wishes for discharge at this time.   [EH]    Clinical Course User Index [EH] Cristina GongHammond, Keyira Mondesir  W, PA-C   Patient presents today for evaluation of epigastric and left upper quadrant abdominal pain that is been going on for approximately 6 weeks however became significantly worse today.  Labs are obtained and reviewed, she  is not anemic does not have a significant leukocytosis.  Her glucose is slightly elevated at 180 which she reports is good for her.  Otherwise she does not have any significant electrolyte derangements.  Lipase is not elevated.  She is not pregnant.  Her urine had a high specific gravity and was hazy with 50 glucose consistent with dehydration.  Pain was treated with morphine, Maalox/p.o. lidocaine after which she had relief of her pain.  CT abdomen pelvis was obtained without evidence of abnormalities or cause for her pain.  Prior to discharge she was given Protonix, 500 cc fluid bolus and 2 mL's additional of morphine.  She was offered additional 500 cc fluid bolus however she declined.  Instructed to increase p.o. fluid intakes especially tonight, she is instructed to not take her metformin for the next 3 days.  She has a follow-up appointment with GI on Wednesday, she was instructed to keep this appointment.  She is given prescriptions for Protonix, and Carafate to help with her symptoms.  Suspect that she may have a gastric or duodenal ulcer causing her symptoms.    While her pain is epigastric in nature, given that it has been going on for 6 weeks do not suspect cardiac or pulmonary cause.  She was able to p.o. challenge in the department without difficulty.  She states she has nausea medicine at home.  Return precautions were discussed with patient who states their understanding.  At the time of discharge patient denied any unaddressed complaints or concerns.  Patient is agreeable for discharge home.   Final Clinical Impressions(s) / ED Diagnoses   Final diagnoses:  Epigastric pain    ED Discharge Orders         Ordered    sucralfate (CARAFATE) 1 g tablet  3 times  daily with meals & bedtime     04/11/18 1959    pantoprazole (PROTONIX) 20 MG tablet  Daily     04/11/18 1959           Cristina Gong, [place] 04/11/18 2302    Tegeler, Canary Brim, MD 04/12/18 1110

## 2018-04-11 NOTE — ED Notes (Signed)
Pt instructed to provide urine sample.

## 2018-04-11 NOTE — Discharge Instructions (Signed)
Your blood sugar was slightly high today.  Please follow-up with your primary care doctor.  As you received contrast today through your IV it is important that you do not take your metformin for the next 3 days.  Please watch what you were eating during this as your sugars may run higher.  You were given some fluid through your IV today, however it is important that you continue to drink extra water to help your body flush this dye out and to rehydrate you.  Today you received medications that may make you sleepy or impair your ability to make decisions.  For the next 24 hours please do not drive, operate heavy machinery, care for a small child with out another adult present, or perform any activities that may cause harm to you or someone else if you were to fall asleep or be impaired.

## 2018-04-11 NOTE — ED Notes (Signed)
Discharge instructions and prescriptions discussed with Pt. Pt verbalized understanding. Pt stable and ambulatory.   

## 2018-04-12 ENCOUNTER — Emergency Department (HOSPITAL_COMMUNITY)
Admission: EM | Admit: 2018-04-12 | Discharge: 2018-04-12 | Disposition: A | Discharge status: Home / Self Care | Payer: BLUE CROSS/BLUE SHIELD | Attending: Emergency Medicine | Admitting: Emergency Medicine

## 2018-04-12 ENCOUNTER — Emergency Department (HOSPITAL_COMMUNITY): Payer: BLUE CROSS/BLUE SHIELD

## 2018-04-12 DIAGNOSIS — Z79899 Other long term (current) drug therapy: Secondary | ICD-10-CM | POA: Diagnosis not present

## 2018-04-12 DIAGNOSIS — E119 Type 2 diabetes mellitus without complications: Secondary | ICD-10-CM | POA: Diagnosis not present

## 2018-04-12 DIAGNOSIS — Z7982 Long term (current) use of aspirin: Secondary | ICD-10-CM | POA: Diagnosis not present

## 2018-04-12 DIAGNOSIS — F17210 Nicotine dependence, cigarettes, uncomplicated: Secondary | ICD-10-CM | POA: Insufficient documentation

## 2018-04-12 DIAGNOSIS — I10 Essential (primary) hypertension: Secondary | ICD-10-CM | POA: Insufficient documentation

## 2018-04-12 DIAGNOSIS — Z7984 Long term (current) use of oral hypoglycemic drugs: Secondary | ICD-10-CM | POA: Insufficient documentation

## 2018-04-12 DIAGNOSIS — R1032 Left lower quadrant pain: Secondary | ICD-10-CM | POA: Diagnosis not present

## 2018-04-12 DIAGNOSIS — R634 Abnormal weight loss: Secondary | ICD-10-CM | POA: Diagnosis not present

## 2018-04-12 LAB — LIPASE, BLOOD: Lipase: 31 U/L (ref 11–51)

## 2018-04-12 LAB — COMPREHENSIVE METABOLIC PANEL
ALK PHOS: 88 U/L (ref 38–126)
ALT: 14 U/L (ref 0–44)
ANION GAP: 12 (ref 5–15)
AST: 14 U/L — ABNORMAL LOW (ref 15–41)
Albumin: 3.4 g/dL — ABNORMAL LOW (ref 3.5–5.0)
BILIRUBIN TOTAL: 0.5 mg/dL (ref 0.3–1.2)
BUN: 7 mg/dL (ref 6–20)
CALCIUM: 8.4 mg/dL — AB (ref 8.9–10.3)
CO2: 21 mmol/L — ABNORMAL LOW (ref 22–32)
CREATININE: 0.67 mg/dL (ref 0.44–1.00)
Chloride: 102 mmol/L (ref 98–111)
GFR calc non Af Amer: >60 mL/min (ref >60)
Glucose, Bld: 219 mg/dL — ABNORMAL HIGH (ref 70–99)
Potassium: 3.3 mmol/L — ABNORMAL LOW (ref 3.5–5.1)
Sodium: 135 mmol/L (ref 135–145)
TOTAL PROTEIN: 6.9 g/dL (ref 6.5–8.1)

## 2018-04-12 LAB — CBC WITH DIFFERENTIAL/PLATELET
ABS IMMATURE GRANULOCYTES: 0.03 10*3/uL (ref 0.00–0.07)
Basophils Absolute: 0 10*3/uL (ref 0.0–0.1)
Basophils Relative: 0 %
EOS PCT: 1 %
Eosinophils Absolute: 0.1 10*3/uL (ref 0.0–0.5)
HCT: 48.8 % — ABNORMAL HIGH (ref 36.0–46.0)
HEMOGLOBIN: 15.9 g/dL — AB (ref 12.0–15.0)
Immature Granulocytes: 0 %
LYMPHS ABS: 2.4 10*3/uL (ref 0.7–4.0)
LYMPHS PCT: 24 %
MCH: 28.8 pg (ref 26.0–34.0)
MCHC: 32.6 g/dL (ref 30.0–36.0)
MCV: 88.2 fL (ref 80.0–100.0)
MONO ABS: 0.6 10*3/uL (ref 0.1–1.0)
MONOS PCT: 6 %
Neutro Abs: 7 10*3/uL (ref 1.7–7.7)
Neutrophils Relative %: 69 %
Platelets: 330 10*3/uL (ref 150–400)
RBC: 5.53 MIL/uL — ABNORMAL HIGH (ref 3.87–5.11)
RDW: 12.9 % (ref 11.5–15.5)
WBC: 10.1 10*3/uL (ref 4.0–10.5)
nRBC: 0 % (ref 0.0–0.2)

## 2018-04-12 MED ORDER — HYDROCODONE-ACETAMINOPHEN 5-325 MG PO TABS: 2.0000 | ORAL_TABLET | ORAL | 0 refills | Status: DC | PRN

## 2018-04-12 MED ORDER — SODIUM CHLORIDE 0.9 % IV BOLUS
1000.0000 mL | Freq: Once | INTRAVENOUS | Status: AC
  Administered 2018-04-12: 1000 mL via INTRAVENOUS

## 2018-04-12 MED ORDER — HYDROMORPHONE HCL 1 MG/ML IJ SOLN
1.0000 mg | Freq: Once | INTRAMUSCULAR | Status: AC
  Administered 2018-04-12: 1 mg via INTRAVENOUS
  Filled 2018-04-12: qty 1

## 2018-04-12 MED ORDER — ONDANSETRON HCL 4 MG/2ML IJ SOLN
4.0000 mg | Freq: Once | INTRAMUSCULAR | Status: AC
  Administered 2018-04-12: 4 mg via INTRAVENOUS
  Filled 2018-04-12: qty 2

## 2018-04-12 MED ORDER — HYDROMORPHONE HCL 1 MG/ML IJ SOLN
1.0000 mg | Freq: Once | INTRAMUSCULAR | Status: DC
  Filled 2018-04-12: qty 1

## 2018-04-12 NOTE — ED Provider Notes (Signed)
[facility] EMERGENCY DEPARTMENT Provider Note   CSN: [id number] Arrival date & time: 04/12/18  1738     History   Chief Complaint Chief Complaint  Patient presents with  . Flank Pain    HPI Victoria Lucas is a 53 y.o. female.  52yo female returns to the ER with left flank pain. Patient was seen in the ER last night for same, pain started about 6 weeks ago, progressively worsening. Diffuse left lumbar radiating to left side abdomen. Pain is worse with eating,      Past Medical History:  Diagnosis Date  . Abnormal nuclear stress test   . Heart murmur    "when I was born; cleared in my ?early teens" (06/15/2016)  . HTN (hypertension)   . Obesity (BMI 30-39.9)   . Pneumonia 01/2015; 02/2015; 01/2016; 02/2016  . Situational anxiety   . Tobacco abuse   . Type II diabetes mellitus [facility])     Patient Active Problem List   Diagnosis Date Noted  . Bifascicular block   . Chest pain 06/15/2016  . Abnormal result of cardiovascular function study 06/15/2016  . Diabetes mellitus type 2, noninsulin dependent (HCC) 06/15/2016  . Hypertension, essential 06/15/2016  . Tobacco abuse 06/15/2016  . Unstable angina (HCC) 06/15/2016  . Obesity (BMI 30-39.9)     Past Surgical History:  Procedure Laterality Date  . CARDIAC CATHETERIZATION  06/2005   Hattie Perch 07/25/2010  . CESAREAN SECTION  2001  . LAPAROSCOPIC CHOLECYSTECTOMY  ~ 2003  . LEFT HEART CATH AND CORONARY ANGIOGRAPHY N/A 06/18/2016   Procedure: Left Heart Cath and Coronary Angiography;  Surgeon: Kathleene Hazel, MD;  Location: [facility] INVASIVE CV LAB;  Service: Cardiovascular;  Laterality: N/A;  . SHOULDER OPEN ROTATOR CUFF REPAIR Right 2011     OB History   No obstetric history on file.      Home Medications    Prior to Admission medications   Medication Sig Start Date End Date Taking? Authorizing Provider  ALPRAZolam Prudy Feeler) 0.5 MG tablet Take 0.5 mg by mouth 2 (two) times daily as needed for  anxiety.    [provider]  aspirin EC 81 MG EC tablet Take 1 tablet (81 mg total) by mouth daily. 06/19/16   Arty Baumgartner, NP  atorvastatin (LIPITOR) 40 MG tablet Take 1 tablet (40 mg total) by mouth daily at 6 PM. 06/18/16   Arty Baumgartner, NP  HYDROcodone-acetaminophen (NORCO/VICODIN) 5-325 MG tablet Take 2 tablets by mouth every 4 (four) hours as needed. 04/12/18   Jeannie Fend, PA-C  metFORMIN (GLUMETZA) 1000 MG (MOD) 24 hr tablet Take 2,000 mg by mouth daily with breakfast.    [provider]  metoprolol tartrate (LOPRESSOR) 25 MG tablet Take 1 tablet (25 mg total) by mouth 2 (two) times daily. 06/18/16   Arty Baumgartner, NP  pantoprazole (PROTONIX) 20 MG tablet Take 1 tablet (20 mg total) by mouth daily for 14 days. 04/11/18 04/25/18  Cristina Gong, PA-C  sucralfate (CARAFATE) 1 g tablet Take 1 tablet (1 g total) by mouth 4 (four) times daily -  with meals and at bedtime for 14 days. 04/11/18 04/25/18  Cristina Gong, PA-C    Family History Family History  Problem Relation Age of Onset  . Valvular heart disease Paternal Grandfather   . Diabetes Mother   . Kidney disease Mother   . Diabetes Father   . Atrial fibrillation Father     Social History Social History  Tobacco Use  . Smoking status: Current Every Day Smoker    Packs/day: 0.50    Years: 37.00    Pack years: 18.50    Types: Cigarettes  . Smokeless tobacco: Never Used  Substance Use Topics  . Alcohol use: Yes    Comment: 06/15/2016 "might have a drink w/friends once/month"  . Drug use: No     Allergies   Codeine; Penicillins; Propoxyphene; and Tamoxifen   Review of Systems Review of Systems  Constitutional: Positive for appetite change and unexpected weight change. Negative for chills and fever.  Respiratory: Negative for shortness of breath.   Cardiovascular: Negative for chest pain.  Gastrointestinal: Positive for abdominal pain and nausea. Negative for abdominal  distention, blood in stool, constipation, diarrhea and vomiting.  Genitourinary: Negative for difficulty urinating, dysuria, frequency and urgency.  Musculoskeletal: Positive for back pain.  Skin: Negative for rash.  Allergic/Immunologic: Negative for immunocompromised state.  Neurological: Negative for weakness.  Hematological: Does not bruise/bleed easily.  Psychiatric/Behavioral: Negative for confusion.  All other systems reviewed and are negative.    Physical Exam Updated Vital Signs BP 113/80 (BP Location: Left Arm)   Pulse 86   Temp 97.9 F (36.6 C) (Oral)   Resp 19   SpO2 99%   Physical Exam Vitals signs and nursing note reviewed.  Constitutional:      General: She is not in acute distress.    Appearance: She is well-developed. She is not ill-appearing, toxic-appearing or diaphoretic.  HENT:     Head: Normocephalic and atraumatic.     Mouth/Throat:     Mouth: Mucous membranes are moist.  Cardiovascular:     Rate and Rhythm: Normal rate and regular rhythm.     Pulses: Normal pulses.     Heart sounds: Normal heart sounds. No murmur.  Pulmonary:     Effort: Pulmonary effort is normal.  Abdominal:     Palpations: Abdomen is soft.     Tenderness: There is abdominal tenderness in the left upper quadrant and left lower quadrant.    Musculoskeletal:       Back:  Skin:    General: Skin is warm and dry.     Findings: No erythema or rash.  Neurological:     Mental Status: She is alert and oriented to person, place, and time.  Psychiatric:        Behavior: Behavior normal.      ED Treatments / Results  Labs (all labs ordered are listed, but only abnormal results are displayed) Labs Reviewed  CBC WITH DIFFERENTIAL/PLATELET - Abnormal; Notable for the following components:      Result Value   RBC 5.53 (*)    Hemoglobin 15.9 (*)    HCT 48.8 (*)    All other components within normal limits  COMPREHENSIVE METABOLIC PANEL - Abnormal; Notable for the following  components:   Potassium 3.3 (*)    CO2 21 (*)    Glucose, Bld 219 (*)    Calcium 8.4 (*)    Albumin 3.4 (*)    AST 14 (*)    All other components within normal limits  LIPASE, BLOOD  URINALYSIS, ROUTINE W REFLEX MICROSCOPIC  H. PYLORI ANTIBODY, IGG    EKG None  Radiology Ct Abdomen Pelvis W Contrast  Result Date: 04/11/2018 CLINICAL DATA:  Left flank pain for several months, worse today. EXAM: CT ABDOMEN AND PELVIS WITH CONTRAST TECHNIQUE: Multidetector CT imaging of the abdomen and pelvis was performed using the standard protocol following bolus  administration of intravenous contrast. CONTRAST:  100 mL OMNIPAQUE IOHEXOL 300 MG/ML  SOLN COMPARISON:  CT abdomen and pelvis 12/25/2012. FINDINGS: Lower chest: Mild dependent atelectasis left lung base noted. Small focus of linear scar in the right middle lobe is unchanged. No pleural or pericardial effusion. Hepatobiliary: Status post cholecystectomy. Liver and biliary tree are unremarkable. Pancreas: Unremarkable. No pancreatic ductal dilatation or surrounding inflammatory changes. Spleen: Normal in size without focal abnormality. Adrenals/Urinary Tract: Benign left adrenal adenoma is noted. Right adrenal gland appears. Kidneys, ureters and urinary bladder appear normal. Stomach/Bowel: Stomach is within normal limits. Appendix appears normal. No evidence of bowel wall thickening, distention, or inflammatory changes. Vascular/Lymphatic: Aortic atherosclerosis. No enlarged abdominal or pelvic lymph nodes. Reproductive: Uterus and bilateral adnexa are unremarkable. Other: None. Musculoskeletal: Negative. IMPRESSION: No acute abnormality abdomen or pelvis. No finding to explain the patient's symptoms. Atherosclerosis. Electronically Signed   By: Drusilla Kannerhomas  Dalessio M.D.   On: 04/11/2018 17:46   Dg Abd Acute W/chest  Result Date: 04/12/2018 CLINICAL DATA:  Acute flank and abdominal pain.  Weight loss. EXAM: DG ABDOMEN ACUTE W/ 1V CHEST COMPARISON:   04/11/2018 CT, 03/04/2018 and prior radiographs FINDINGS: Cardiomediastinal silhouette is unremarkable. LEFT mid lung subsegmental scar again noted. No airspace disease, pleural effusion or pneumothorax. The bowel gas pattern is unremarkable. No evidence of bowel obstruction or pneumoperitoneum. Cholecystectomy clips are present. No suspicious calcifications are identified. No acute bony abnormalities are identified. IMPRESSION: Negative abdominal radiographs.  No acute cardiopulmonary disease. Electronically Signed   By: Harmon PierJeffrey  Hu M.D.   On: 04/12/2018 21:10    Procedures Procedures (including critical care time)  Medications Ordered in ED Medications  HYDROmorphone (DILAUDID) injection 1 mg (has no administration in time range)  sodium chloride 0.9 % bolus 1,000 mL (0 mLs Intravenous Stopped 04/12/18 2014)  HYDROmorphone (DILAUDID) injection 1 mg (1 mg Intravenous Given 04/12/18 1843)  ondansetron (ZOFRAN) injection 4 mg (4 mg Intravenous Given 04/12/18 1843)     Initial Impression / Assessment and Plan / ED Course  I have reviewed the triage vital signs and the nursing notes.  Pertinent labs & imaging results that were available during my care of the patient were reviewed by me and considered in my medical decision making (see chart for details).  Clinical Course as of Apr 12 2121  Sat Apr 12, 2018  [id number] 53 year old female returns for ongoing left-sided back and abdominal pain.  Patient seen in the emergency room yesterday, had labs and CT without definitive diagnosis made.  Patient is scheduled to follow-up with GI on February 5 however pain was worse and felt she could not wait.  Initial exam patient had diffuse left side abdominal and back pain.  Repeat abdominal exam now with left lower quadrant tenderness.  Lab work is unchanged compared to yesterday including CBC, lipase, CMP.  H. pylori sent out, advised patient this would be for follow-up planning purposes.  X-ray abdominal series  ordered to evaluate for free air under the diaphragm for possible perforated ulcer however no signs of peritonitis or surgical abdomen at this time.   [LM]  2122 X-ray abdominal series is unremarkable.  Discussed results and plan of care with patient and family.  Patient given prescription for Norco for pain to continue with her Carafate and Protonix.  Patient should follow-up with GI however if pain worsens or symptoms progress patient should return to emergency room.   [LM]    Clinical Course User Index [LM] Jeannie FendMurphy, Laura A, PA-C  Final Clinical Impressions(s) / ED Diagnoses   Final diagnoses:  Left lower quadrant abdominal pain    ED Discharge Orders         Ordered    HYDROcodone-acetaminophen (NORCO/VICODIN) 5-325 MG tablet  Every 4 hours PRN     04/12/18 2112           Jeannie Fend, PA-C 04/12/18 2123    Mesner, Barbara Cower, MD 04/12/18 2250

## 2018-04-12 NOTE — ED Notes (Signed)
Patient verbalizes understanding of discharge instructions. Opportunity for questioning and answers were provided. Armband removed by staff, pt discharged from ED.  

## 2018-04-12 NOTE — Discharge Instructions (Addendum)
Follow-up with GI as planned.  Return to ER for new or worsening symptoms. Take Protonix and Carafate as prescribed to ER visit yesterday.  Take Norco as needed as prescribed for pain.  Do not drive or operate machinery while taking Norco.

## 2018-04-12 NOTE — ED Notes (Signed)
Patient transported to X-ray 

## 2018-04-14 LAB — H. PYLORI ANTIBODY, IGG: H Pylori IgG: 0.28 Index Value (ref 0.00–0.79)

## 2018-04-16 ENCOUNTER — Encounter: Payer: Self-pay | Admitting: Gastroenterology

## 2018-04-16 ENCOUNTER — Ambulatory Visit: Payer: BLUE CROSS/BLUE SHIELD | Admitting: Physician Assistant

## 2018-04-16 ENCOUNTER — Encounter: Payer: Self-pay | Admitting: Physician Assistant

## 2018-04-16 VITALS — BP 100/62 | HR 100 | Ht 64.0 in | Wt 169.4 lb

## 2018-04-16 DIAGNOSIS — R1012 Left upper quadrant pain: Secondary | ICD-10-CM

## 2018-04-16 DIAGNOSIS — R634 Abnormal weight loss: Secondary | ICD-10-CM

## 2018-04-16 DIAGNOSIS — R110 Nausea: Secondary | ICD-10-CM

## 2018-04-16 DIAGNOSIS — R1013 Epigastric pain: Secondary | ICD-10-CM | POA: Diagnosis not present

## 2018-04-16 MED ORDER — AMBULATORY NON FORMULARY MEDICATION: 0 refills | Status: AC

## 2018-04-16 MED ORDER — HYDROCODONE-ACETAMINOPHEN 5-325 MG PO TABS: 2.0000 | ORAL_TABLET | ORAL | 0 refills | Status: AC | PRN

## 2018-04-16 MED ORDER — PANTOPRAZOLE SODIUM 40 MG PO TBEC: 40.0000 mg | DELAYED_RELEASE_TABLET | Freq: Two times a day (BID) | ORAL | 1 refills | Status: AC

## 2018-04-16 NOTE — Progress Notes (Signed)
Chief Complaint: Left upper quadrant/epigastric abdominal pain  HPI:    [name] is a 53 year old Caucasian female with a past medical history as listed below, who was referred to me by Estanislado PandySasser, Paul W, MD for a complaint of left upper quadrant/epigastric abdominal pain.      03/14/2018 office visit with PCP to discuss this pain which was located in the left upper quadrant.  At that time had labs showing a mildly elevated lipase at 85 and otherwise normal CBC and CMP.  Abdominal ultrasound 03/20/2018 was normal other than surgically absent gallbladder.  At that time abdominal x-ray showed moderate stool retention within the transverse colon.    04/12/2018 patient seen in the ED for left upper quadrant/left-sided abdominal pain.  CT abdomen pelvis with no acute abnormality in the abdomen or pelvis no findings to explain the patient's symptoms.  Atherosclerosis.  Abdominal x-ray with negative abdominal radiographs no acute cardiopulmonary disease.  CBC, CMP and lipase were normal.    Today, the patient presents to clinic and explains that in mid November she started with this pain which she describes as starting in her back and then radiating around to her left upper quadrant.  This was worse when she would eat or lay down to sleep.  This has worsened over the past week or so.  This past Friday when she was seen in the ER this was rated as a 10 /10 back pain which prevented her from working.  Prior to this patient tells me she could typically get through the day fine but then would notice this when she got home.  Has had a decrease in appetite over this time,with some nausea.  Initially treated for a back injury with Flexeril and Prednisone which made no difference.  Then treated for a kidney stone with no help.  Most recently in the ER over the weekend started on Pantoprazole 20 mg daily and Carafate which she has been using at night.  Describes that only the Hydrocodone helps and only she uses it every 4  hours.  Continues with nausea at the thought of eating.    Denies fever, chills, weight loss, anorexia, vomiting, use of NSAIDs or change in bowel habits including melena or hematochezia.  Past Medical History:  Diagnosis Date  . Abnormal nuclear stress test   . Anxiety   . Colon polyps   . Depression   . Gallstones   . Heart murmur    "when I was born; cleared in my ?early teens" (06/15/2016)  . HTN (hypertension)   . Obesity (BMI 30-39.9)   . Pneumonia 01/2015; 02/2015; 01/2016; 02/2016  . Situational anxiety   . Tobacco abuse   . Type II diabetes mellitus (HCC)     Past Surgical History:  Procedure Laterality Date  . CARDIAC CATHETERIZATION  06/2005   Hattie Perch/notes 07/25/2010  . CESAREAN SECTION  2001  . LAPAROSCOPIC CHOLECYSTECTOMY  ~ 2003  . LEFT HEART CATH AND CORONARY ANGIOGRAPHY N/A 06/18/2016   Procedure: Left Heart Cath and Coronary Angiography;  Surgeon: Kathleene Hazelhristopher D McAlhany, MD;  [facility];  Service: Cardiovascular;  Laterality: N/A;  . SHOULDER OPEN ROTATOR CUFF REPAIR Right 2011    Current Outpatient Medications  Medication Sig Dispense Refill  . ALPRAZolam (XANAX) 0.5 MG tablet Take 0.5 mg by mouth 2 (two) times daily as needed for anxiety.    Marland Kitchen. aspirin EC 81 MG EC tablet Take 1 tablet (81 mg total) by mouth daily. 30 tablet   .  atorvastatin (LIPITOR) 40 MG tablet Take 1 tablet (40 mg total) by mouth daily at 6 PM. 30 tablet 6  . HYDROcodone-acetaminophen (NORCO/VICODIN) 5-325 MG tablet Take 2 tablets by mouth every 4 (four) hours as needed. 10 tablet 0  . metFORMIN (GLUMETZA) 1000 MG (MOD) 24 hr tablet Take 2,000 mg by mouth daily with breakfast.    . metoprolol tartrate (LOPRESSOR) 25 MG tablet Take 1 tablet (25 mg total) by mouth 2 (two) times daily. 60 tablet 6  . pantoprazole (PROTONIX) 20 MG tablet Take 1 tablet (20 mg total) by mouth daily for 14 days. 14 tablet 0  . Semaglutide (OZEMPIC, 0.25 OR 0.5 MG/DOSE, ) Inject 0.5 mg into the skin once  a week.    . sucralfate (CARAFATE) 1 g tablet Take 1 tablet (1 g total) by mouth 4 (four) times daily -  with meals and at bedtime for 14 days. 56 tablet 0   No current facility-administered medications for this visit.     Allergies as of 04/16/2018 - Review Complete 04/16/2018  Allergen Reaction Noted  . Codeine Rash 10/26/2013  . Penicillins Rash 10/26/2013  . Propoxyphene  06/15/2016  . Tamoxifen  06/15/2016    Family History  Problem Relation Age of Onset  . Valvular heart disease Paternal Grandfather   . Diabetes Mother   . Kidney disease Mother   . Diabetes Father   . Atrial fibrillation Father     Social History   Socioeconomic History  . Marital status: Married    Spouse name: Not on file  . Number of children: 1  . Years of education: Not on file  . Highest education level: Not on file  Occupational History  . Occupation: Firefighter living  Social Needs  . Financial resource strain: Not on file  . Food insecurity:    Worry: Not on file    Inability: Not on file  . Transportation needs:    Medical: Not on file    Non-medical: Not on file  Tobacco Use  . Smoking status: Current Every Day Smoker    Packs/day: 0.50    Years: 37.00    Pack years: 18.50    Types: Cigarettes  . Smokeless tobacco: Never Used  Substance and Sexual Activity  . Alcohol use: Yes    Comment: 06/15/2016 "might have a drink w/friends once/month"  . Drug use: No  . Sexual activity: Yes  Lifestyle  . Physical activity:    Days per week: Not on file    Minutes per session: Not on file  . Stress: Not on file  Relationships  . Social connections:    Talks on phone: Not on file    Gets together: Not on file    Attends religious service: Not on file    Active member of club or organization: Not on file    Attends meetings of clubs or organizations: Not on file    Relationship status: Not on file  . Intimate partner violence:    Fear of current or ex partner: Not on  file    Emotionally abused: Not on file    Physically abused: Not on file    Forced sexual activity: Not on file  Other Topics Concern  . Not on file  Social History Narrative  . Not on file    Review of Systems:    Constitutional: No weight loss, fever or chills Skin: No rash  Cardiovascular: No chest pain Respiratory: No SOB  Gastrointestinal: See  HPI and otherwise negative Genitourinary: No dysuria  Neurological: No headache, dizziness or syncope Musculoskeletal: No new muscle or joint pain Hematologic: No bleeding  Psychiatric: No history of depression or anxiety   Physical Exam:  Vital signs: BP 100/62 (BP Location: Left Arm, Patient Position: Sitting, Cuff Size: Normal)   Pulse 100   Ht 5\' 4"  (1.626 m) Comment: height measured without shoes  Wt 169 lb 6 oz (76.8 kg)   BMI 29.07 kg/m   Constitutional:   Pleasant Caucasian female appears to be in NAD, Well developed, Well nourished, alert and cooperative Head:  Normocephalic and atraumatic. Eyes:   PEERL, EOMI. No icterus. Conjunctiva pink. Ears:  Normal auditory acuity. Neck:  Supple Throat: Oral cavity and pharynx without inflammation, swelling or lesion.  Respiratory: Respirations even and unlabored. Lungs clear to auscultation bilaterally.   No wheezes, crackles, or rhonchi.  Cardiovascular: Normal S1, S2. No MRG. Regular rate and rhythm. No peripheral edema, cyanosis or pallor.  Gastrointestinal:  Soft, nondistended, marked epigastric and moderate LUQ ttp with involuntary guarding. Normal bowel sounds. No appreciable masses or hepatomegaly. Rectal:  Not performed.  Msk:  Symmetrical without gross deformities. Without edema, no deformity or joint abnormality.  Neurologic:  Alert and  oriented x4;  grossly normal neurologically.  Skin:   Dry and intact without significant lesions or rashes. Psychiatric: Demonstrates good judgement and reason without abnormal affect or behaviors.  MOST RECENT LABS AND IMAGING (and  see HPI): CBC    Component Value Date/Time   WBC 10.1 04/12/2018 1850   RBC 5.53 (H) 04/12/2018 1850   HGB 15.9 (H) 04/12/2018 1850   HCT 48.8 (H) 04/12/2018 1850   PLT 330 04/12/2018 1850   MCV 88.2 04/12/2018 1850   MCH 28.8 04/12/2018 1850   MCHC 32.6 04/12/2018 1850   RDW 12.9 04/12/2018 1850   LYMPHSABS 2.4 04/12/2018 1850   MONOABS 0.6 04/12/2018 1850   EOSABS 0.1 04/12/2018 1850   BASOSABS 0.0 04/12/2018 1850    CMP     Component Value Date/Time   NA 135 04/12/2018 1850   K 3.3 (L) 04/12/2018 1850   CL 102 04/12/2018 1850   CO2 21 (L) 04/12/2018 1850   GLUCOSE 219 (H) 04/12/2018 1850   BUN 7 04/12/2018 1850   CREATININE 0.67 04/12/2018 1850   CALCIUM 8.4 (L) 04/12/2018 1850   PROT 6.9 04/12/2018 1850   ALBUMIN 3.4 (L) 04/12/2018 1850   AST 14 (L) 04/12/2018 1850   ALT 14 04/12/2018 1850   ALKPHOS 88 04/12/2018 1850   BILITOT 0.5 04/12/2018 1850   GFRNONAA >60 04/12/2018 1850   GFRAA >60 04/12/2018 1850    Assessment: 1.  Epigastric/left upper quadrant pain: Severe per the patient, now prohibiting her from her daily activities over the past 4 to 5 days, previous CT, ultrasound and labs all normal, only recently started on Pantoprazole 20 mg daily in the ER over the weekend with no help so far, no history of NSAID use; consider PUD versus H. pylori versus gastritis versus other 2.  Nausea and decrease in appetite: With above, loss of 16 pounds over the past 2 months 3. Weight loss  Plan: 1.  Scheduled patient for an [facility] tomorrow [facility].  Did discuss risks, benefits, limitations and alternatives and the patient agrees to proceed.  Patient will follow with Dr. Adela LankArmbruster after time of procedure. 2.  Increased Pantoprazole to 40 mg twice daily, 30-60 minutes before breakfast  and dinner #60 with 5 refills 3.  Discussed Carafate with the patient.  If she would like to try this she will need to take it an hour before or 2 hours  after eating and other medications. 4.  Reviewed antireflux diet and lifestyle modifications. 5.  Refilled patient's Hydrocodone #15 with no further refills. 6.  Patient to follow in clinic per recommendations from Dr. Adela Lank after time of procedure.  Hyacinth Meeker, PA-C [facility] 04/16/2018, 3:14 PM  Cc: Estanislado Pandy, MD

## 2018-04-16 NOTE — Patient Instructions (Addendum)
.  You have been scheduled for an endoscopy. Please follow written instructions given to you at your visit today. If you use inhalers (even only as needed), please bring them with you on the day of your procedure. Your physician has requested that you go to www.startemmi.com and enter the access code given to you at your visit today. This web site gives a general overview about your procedure. However, you should still follow specific instructions given to you by our office regarding your preparation for the procedure.  If you are age 85 or older, your body mass index should be between 23-30. Your Body mass index is 29.07 kg/m. If this is out of the aforementioned range listed, please consider follow up with your Primary Care Provider.  If you are age 109 or younger, your body mass index should be between 19-25. Your Body mass index is 29.07 kg/m. If this is out of the aformentioned range listed, please consider follow up with your Primary Care Provider.   We have sent the following medications to your pharmacy for you to pick up at your convenience: Pantoprazole, GI cocktail, Hydrocodone   Thank you for choosing me and [facility].

## 2018-04-17 ENCOUNTER — Ambulatory Visit (AMBULATORY_SURGERY_CENTER): Payer: BLUE CROSS/BLUE SHIELD | Admitting: Gastroenterology

## 2018-04-17 ENCOUNTER — Encounter: Payer: Self-pay | Admitting: Gastroenterology

## 2018-04-17 VITALS — BP 113/63 | HR 71 | Temp 97.5°F | Resp 15 | Ht 64.0 in | Wt 169.0 lb

## 2018-04-17 DIAGNOSIS — K3189 Other diseases of stomach and duodenum: Secondary | ICD-10-CM | POA: Diagnosis not present

## 2018-04-17 DIAGNOSIS — K317 Polyp of stomach and duodenum: Secondary | ICD-10-CM

## 2018-04-17 DIAGNOSIS — R1010 Upper abdominal pain, unspecified: Secondary | ICD-10-CM

## 2018-04-17 DIAGNOSIS — K2970 Gastritis, unspecified, without bleeding: Secondary | ICD-10-CM | POA: Diagnosis not present

## 2018-04-17 DIAGNOSIS — R1310 Dysphagia, unspecified: Secondary | ICD-10-CM | POA: Diagnosis not present

## 2018-04-17 DIAGNOSIS — K2270 Barrett's esophagus without dysplasia: Secondary | ICD-10-CM | POA: Diagnosis not present

## 2018-04-17 MED ORDER — GABAPENTIN 300 MG PO CAPS: 300.0000 mg | ORAL_CAPSULE | Freq: Every day | ORAL | 1 refills | Status: AC

## 2018-04-17 MED ORDER — SODIUM CHLORIDE 0.9 % IV SOLN: 500.0000 mL | Freq: Once | INTRAVENOUS | Status: AC

## 2018-04-17 NOTE — Progress Notes (Signed)
Called to room to assist during endoscopic procedure.  Patient ID and intended procedure confirmed with present staff. Received instructions for my participation in the procedure from the performing physician.  

## 2018-04-17 NOTE — Progress Notes (Signed)
To PACU, VSS. Report to [email or web address] 

## 2018-04-17 NOTE — Op Note (Addendum)
[facility] Patient Name: Victoria Lucas Procedure Date: 04/17/2018 9:54 AM MRN: [id number] Endoscopist: Viviann SpareSteven P. Adela LankArmbruster , MD Age: 53 Referring MD:  Date of Birth: [birth year] Gender: Female Account #: [id number] Procedure:                Upper GI endoscopy Indications:              Abdominal pain in the left flank radiation to left                            upper quadrant and epigastric area, Nausea - CT                            scan and US without clear cause, labs normal, EGD                            to further evaluate - just started on protonix 40mg                             once daily Medicines:                Monitored Anesthesia Care Procedure:                Pre-Anesthesia Assessment:                           - Prior to the procedure, a History and Physical                            was performed, and patient medications and                            allergies were reviewed. The patient's tolerance of                            previous anesthesia was also reviewed. The risks                            and benefits of the procedure and the sedation                            options and risks were discussed with the patient.                            All questions were answered, and informed consent                            was obtained. Prior Anticoagulants: The patient has                            taken no previous anticoagulant or antiplatelet                            agents. ASA Grade Assessment: III - A patient with  severe systemic disease. After reviewing the risks                            and benefits, the patient was deemed in                            satisfactory condition to undergo the procedure.                           After obtaining informed consent, the endoscope was                            passed under direct vision. Throughout the                            procedure, the patient's blood pressure,  pulse, and                            oxygen saturations were monitored continuously. The                            Endoscope was introduced through the mouth, and                            advanced to the second part of duodenum. The upper                            GI endoscopy was accomplished without difficulty.                            The patient tolerated the procedure well. Scope In: Scope Out: Findings:                 Esophagogastric landmarks were identified: the                            Z-line was found at 35 cm, the gastroesophageal                            junction was found at 36 cm and the upper extent of                            the gastric folds was found at 37 cm from the                            incisors.                           A suspected short segment of Barrett's esophagus                            was present in the lower third of the esophagus,  extending roughly 1cm up fron the GEJ. The maximum                            longitudinal extent of these mucosal changes was 1                            cm in length. No nodularity. Biopsies were taken                            with a cold forceps for histology.                           The exam of the esophagus was otherwise normal.                           Multiple small to medium sized sessile polyps                            (largest 5-386mm in size) were found in the gastric                            body. Biopsies were taken with a cold forceps for                            histology.                           The exam of the stomach was otherwise normal.                           Biopsies were taken with a cold forceps in the                            gastric body, at the incisura and in the gastric                            antrum for Helicobacter pylori testing.                           The duodenal bulb and second portion of the                             duodenum were normal. Complications:            No immediate complications. Estimated blood loss:                            Minimal. Estimated Blood Loss:     Estimated blood loss was minimal. Impression:               - Esophagogastric landmarks identified.                           - Possible 1cm segment of Barrett's esophagus.  Biopsied.                           - Multiple benign appearing gastric polyps.                            Biopsied.                           - Normal stomach otherwise - biopsies obtained to                            rule out H pylori                           - Normal duodenal bulb and second portion of the                            duodenum.                           Overall no clear cause for symptoms given EGD                            findings but will await biopsy results. Based on                            physical exam I suspect her pain could be                            musculoskeletal / costochrondritis however has not                            responded to NSAIDs / steroids / muscle relaxers                            thus far. Recommendation:           - Patient has a contact number available for                            emergencies. The signs and symptoms of potential                            delayed complications were discussed with the                            patient. Return to normal activities tomorrow.                            Written discharge instructions were provided to the                            patient.                           - Resume previous  diet.                           - Continue present medications. Agree with a trial                            of protonix                           - Await pathology results.                           - Consideration for trial of Gabapentin 300mg  every                            night for possible musculosketal pain Kendle Turbin P. Dejae Bernet,  MD 04/17/2018 10:33:44 AM This report has been signed electronically.

## 2018-04-17 NOTE — Progress Notes (Signed)
Agree with assessment and plan as outlined.  

## 2018-04-17 NOTE — Patient Instructions (Addendum)
YOU HAD AN ENDOSCOPIC PROCEDURE TODAY AT THE [facility]:   Refer to the procedure report that was given to you for any specific questions about what was found during the examination.  If the procedure report does not answer your questions, please call your gastroenterologist to clarify.  If you requested that your care partner not be given the details of your procedure findings, then the procedure report has been included in a sealed envelope for you to review at your convenience later.  YOU SHOULD EXPECT: Some feelings of bloating in the abdomen. Passage of more gas than usual.  Walking can help get rid of the air that was put into your GI tract during the procedure and reduce the bloating. If you had a lower endoscopy (such as a colonoscopy or flexible sigmoidoscopy) you may notice spotting of blood in your stool or on the toilet paper. If you underwent a bowel prep for your procedure, you may not have a normal bowel movement for a few days.  Please Note:  You might notice some irritation and congestion in your nose or some drainage.  This is from the oxygen used during your procedure.  There is no need for concern and it should clear up in a day or so.  SYMPTOMS TO REPORT IMMEDIATELY:   Following upper endoscopy (EGD)  Vomiting of blood or coffee ground material  New chest pain or pain under the shoulder blades  Painful or persistently difficult swallowing  New shortness of breath  Fever of 100F or higher  Black, tarry-looking stools  For urgent or emergent issues, a gastroenterologist can be reached at any hour by calling [phone].   DIET:  We do recommend a small meal at first, but then you may proceed to your regular diet.  Drink plenty of fluids but you should avoid alcoholic beverages for 24 hours.  MEDICATIONS: Continue present medications as well as a trial of Protonix. Try Gabapentin (Neurontin) 300 mg by mouth daily at bedtime, #30 with one refill. This  prescription has been electronically sent to your pharmacy.  Please see handouts given to you by your recovery nurse.  ACTIVITY:  You should plan to take it easy for the rest of today and you should NOT DRIVE or use heavy machinery until tomorrow (because of the sedation medicines used during the test).    FOLLOW UP: Our staff will call the number listed on your records the next business day following your procedure to check on you and address any questions or concerns that you may have regarding the information given to you following your procedure. If we do not reach you, we will leave a message.  However, if you are feeling well and you are not experiencing any problems, there is no need to return our call.  We will assume that you have returned to your regular daily activities without incident.  If any biopsies were taken you will be contacted by phone or by letter within the next 1-3 weeks.  Please call us at [phone] if you have not heard about the biopsies in 3 weeks.   Thank you for allowing [place] to provide for your healthcare needs today.  SIGNATURES/CONFIDENTIALITY: You and/or your care partner have signed paperwork which will be entered into your electronic medical record.  These signatures attest to the fact that that the information above on your After Visit Summary has been reviewed and is understood.  Full responsibility of the confidentiality of this discharge information  lies with you and/or your care-partner.

## 2018-04-18 ENCOUNTER — Telehealth: Payer: Self-pay | Admitting: *Deleted

## 2018-04-18 NOTE — Telephone Encounter (Signed)
  Follow up Call- No answer. Unable to leave message as mailbox is full.

## 2018-04-24 DIAGNOSIS — E1142 Type 2 diabetes mellitus with diabetic polyneuropathy: Secondary | ICD-10-CM | POA: Diagnosis not present

## 2018-04-24 DIAGNOSIS — I10 Essential (primary) hypertension: Secondary | ICD-10-CM | POA: Diagnosis not present

## 2018-04-24 DIAGNOSIS — R1012 Left upper quadrant pain: Secondary | ICD-10-CM | POA: Diagnosis not present

## 2018-04-24 DIAGNOSIS — G629 Polyneuropathy, unspecified: Secondary | ICD-10-CM | POA: Diagnosis not present

## 2018-05-27 DIAGNOSIS — I10 Essential (primary) hypertension: Secondary | ICD-10-CM | POA: Diagnosis not present

## 2018-05-27 DIAGNOSIS — Z79899 Other long term (current) drug therapy: Secondary | ICD-10-CM | POA: Diagnosis not present

## 2018-05-27 DIAGNOSIS — E1142 Type 2 diabetes mellitus with diabetic polyneuropathy: Secondary | ICD-10-CM | POA: Diagnosis not present

## 2018-05-27 DIAGNOSIS — F17200 Nicotine dependence, unspecified, uncomplicated: Secondary | ICD-10-CM | POA: Diagnosis not present

## 2018-05-27 DIAGNOSIS — E1165 Type 2 diabetes mellitus with hyperglycemia: Secondary | ICD-10-CM | POA: Diagnosis not present

## 2018-05-27 DIAGNOSIS — E785 Hyperlipidemia, unspecified: Secondary | ICD-10-CM | POA: Diagnosis not present

## 2018-05-27 DIAGNOSIS — Z7984 Long term (current) use of oral hypoglycemic drugs: Secondary | ICD-10-CM | POA: Diagnosis not present

## 2018-12-08 DIAGNOSIS — E1140 Type 2 diabetes mellitus with diabetic neuropathy, unspecified: Secondary | ICD-10-CM | POA: Diagnosis not present

## 2018-12-08 DIAGNOSIS — I10 Essential (primary) hypertension: Secondary | ICD-10-CM | POA: Diagnosis not present

## 2018-12-08 DIAGNOSIS — R1012 Left upper quadrant pain: Secondary | ICD-10-CM | POA: Diagnosis not present

## 2018-12-08 DIAGNOSIS — E1165 Type 2 diabetes mellitus with hyperglycemia: Secondary | ICD-10-CM | POA: Diagnosis not present

## 2018-12-08 DIAGNOSIS — G629 Polyneuropathy, unspecified: Secondary | ICD-10-CM | POA: Diagnosis not present

## 2019-06-25 DIAGNOSIS — Z23 Encounter for immunization: Secondary | ICD-10-CM | POA: Diagnosis not present

## 2019-07-17 DIAGNOSIS — Z23 Encounter for immunization: Secondary | ICD-10-CM | POA: Diagnosis not present

## 2019-11-20 IMAGING — DX DG ABDOMEN ACUTE W/ 1V CHEST
3 series · 3 of 3 positions shown · non-contrast
Comparison: 04/11/2018 CT, 03/04/2018 and prior radiographs

CLINICAL DATA: Acute flank and abdominal pain.  Weight loss.

EXAM:
DG ABDOMEN ACUTE W/ 1V CHEST

[chest pa]
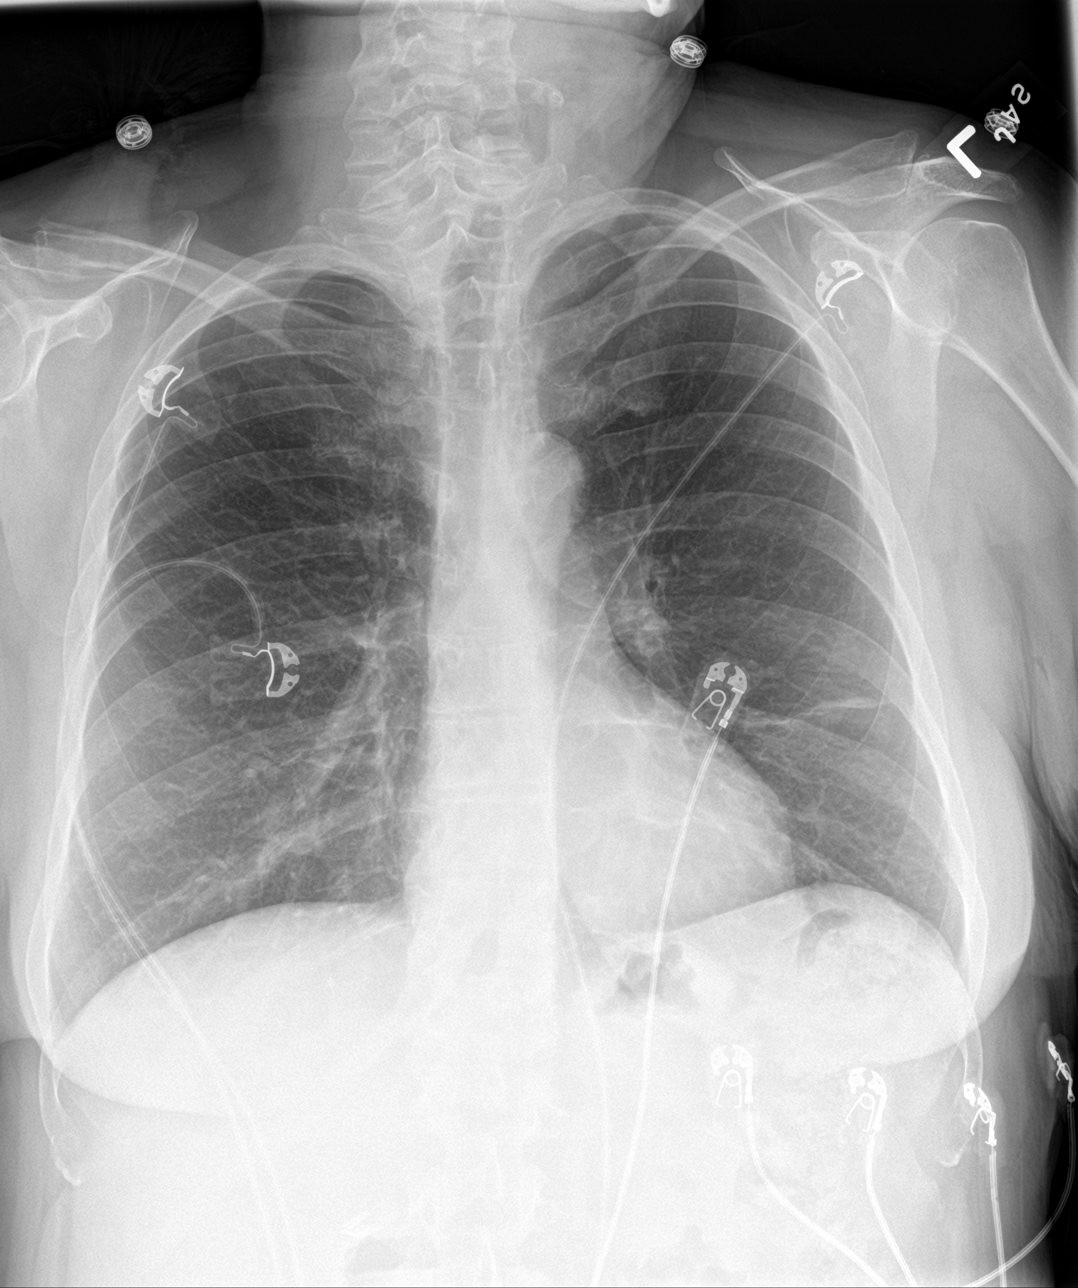

[abdomen erect]
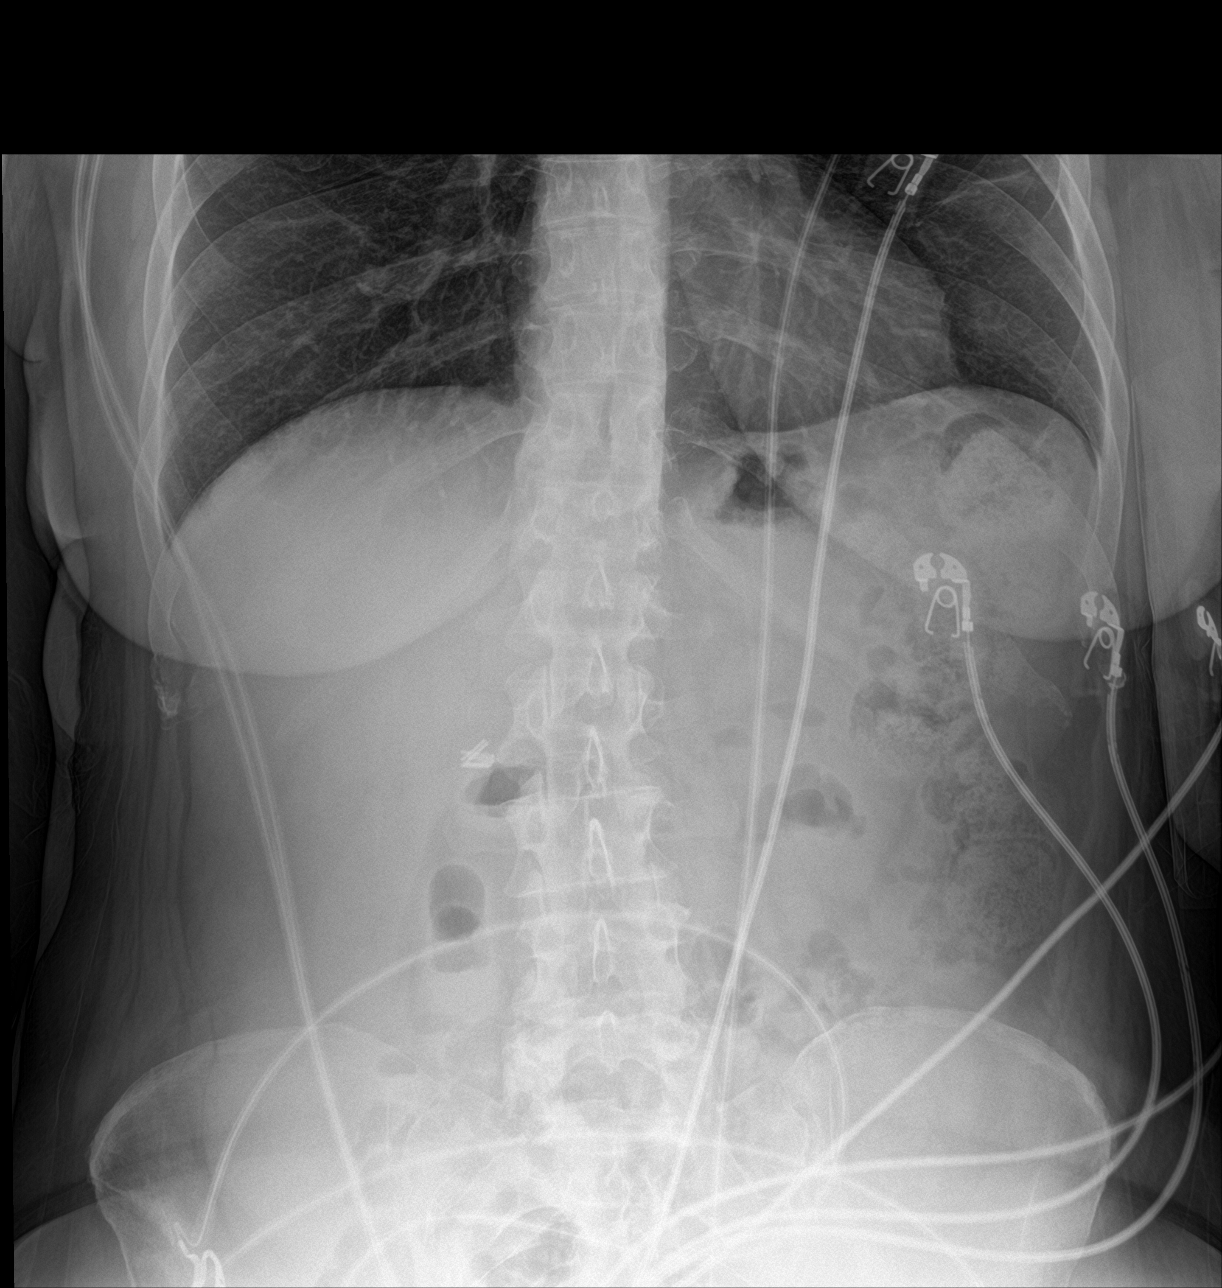

[abdomen supine]
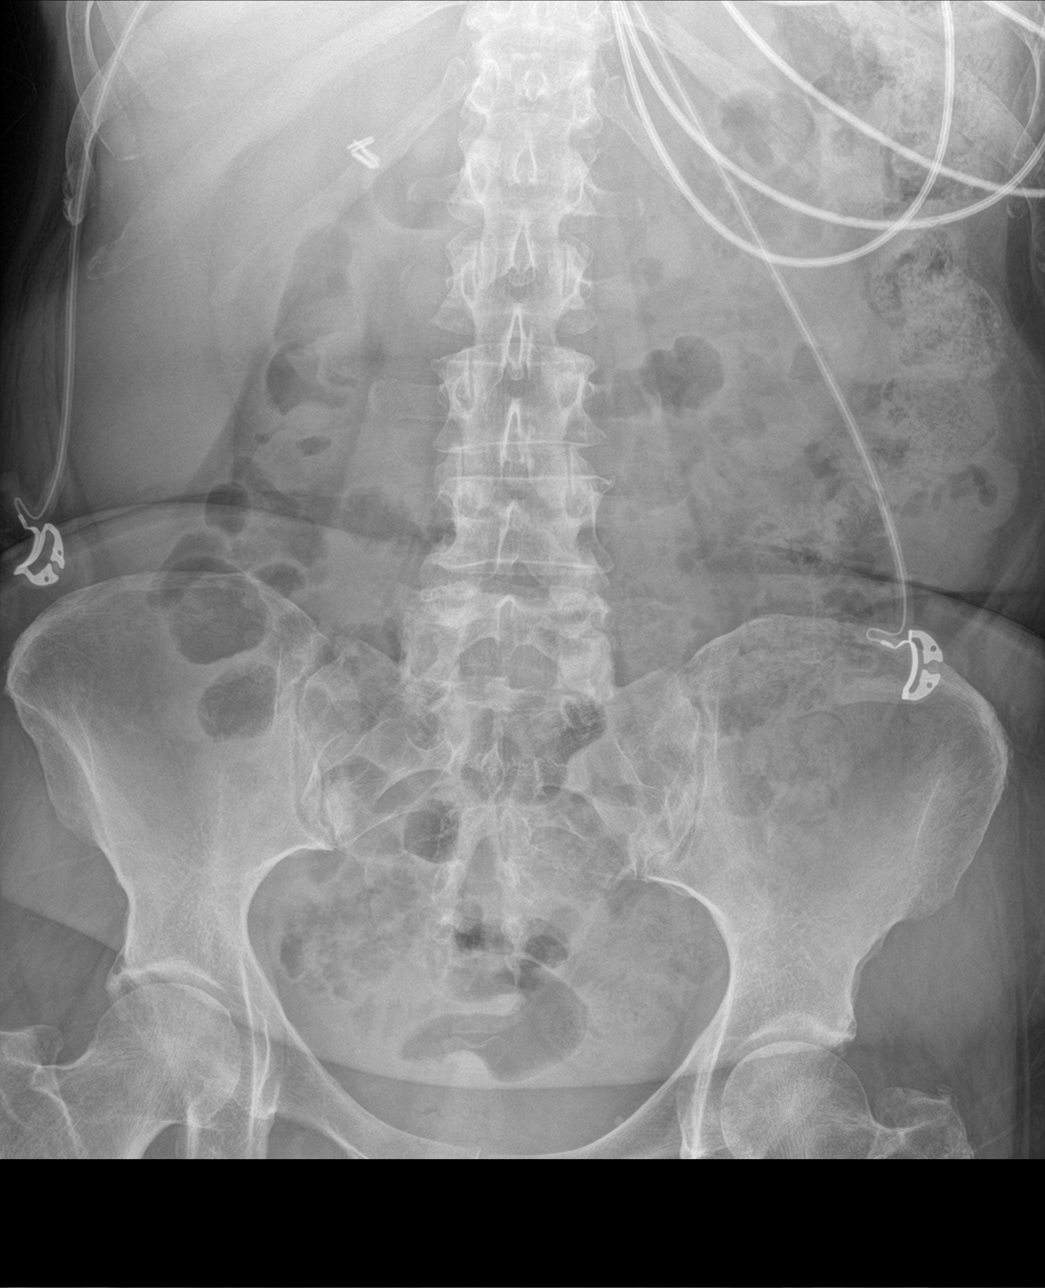

[3 of 3 positions shown; findings below may reference images not displayed]

FINDINGS: Cardiomediastinal silhouette is unremarkable. LEFT mid lung
subsegmental scar again noted.

No airspace disease, pleural effusion or pneumothorax.

The bowel gas pattern is unremarkable.

No evidence of bowel obstruction or pneumoperitoneum.

Cholecystectomy clips are present.

No suspicious calcifications are identified.

No acute bony abnormalities are identified.
IMPRESSION: Negative abdominal radiographs.  No acute cardiopulmonary disease.
